# Patient Record
Sex: Female | Born: 1960
Health system: Southern US, Community
[De-identification: ages and names within clinical notes are randomized; demographics above are authoritative.]

## PROBLEM LIST (undated history)

## (undated) ENCOUNTER — Emergency Department (HOSPITAL_COMMUNITY): Payer: BLUE CROSS/BLUE SHIELD | Source: Home / Self Care

## (undated) DIAGNOSIS — R0600 Dyspnea, unspecified: Secondary | ICD-10-CM

## (undated) DIAGNOSIS — F0280 Dementia in other diseases classified elsewhere without behavioral disturbance: Secondary | ICD-10-CM

## (undated) DIAGNOSIS — J302 Other seasonal allergic rhinitis: Secondary | ICD-10-CM

## (undated) DIAGNOSIS — H538 Other visual disturbances: Secondary | ICD-10-CM

## (undated) DIAGNOSIS — I10 Essential (primary) hypertension: Secondary | ICD-10-CM

## (undated) DIAGNOSIS — G43909 Migraine, unspecified, not intractable, without status migrainosus: Secondary | ICD-10-CM

## (undated) DIAGNOSIS — E663 Overweight: Secondary | ICD-10-CM

## (undated) DIAGNOSIS — G300 Alzheimer's disease with early onset: Secondary | ICD-10-CM

## (undated) DIAGNOSIS — R4189 Other symptoms and signs involving cognitive functions and awareness: Secondary | ICD-10-CM

## (undated) HISTORY — DX: Alzheimer's disease with early onset: G30.0

## (undated) HISTORY — DX: Migraine, unspecified, not intractable, without status migrainosus: G43.909

## (undated) HISTORY — DX: Other visual disturbances: H53.8

## (undated) HISTORY — DX: Other symptoms and signs involving cognitive functions and awareness: R41.89

## (undated) HISTORY — DX: Essential (primary) hypertension: I10

## (undated) HISTORY — DX: Dyspnea, unspecified: R06.00

## (undated) HISTORY — DX: Dementia in other diseases classified elsewhere without behavioral disturbance: F02.80

## (undated) HISTORY — DX: Overweight: E66.3

## (undated) HISTORY — DX: Other seasonal allergic rhinitis: J30.2

---

## 1988-05-14 HISTORY — PX: TUBAL LIGATION: SHX77

## 2004-05-14 DIAGNOSIS — G43909 Migraine, unspecified, not intractable, without status migrainosus: Secondary | ICD-10-CM

## 2004-05-14 HISTORY — DX: Migraine, unspecified, not intractable, without status migrainosus: G43.909

## 2009-05-14 DIAGNOSIS — I10 Essential (primary) hypertension: Secondary | ICD-10-CM

## 2009-05-14 HISTORY — DX: Essential (primary) hypertension: I10

## 2012-05-14 DIAGNOSIS — J302 Other seasonal allergic rhinitis: Secondary | ICD-10-CM

## 2012-05-14 HISTORY — DX: Other seasonal allergic rhinitis: J30.2

## 2016-02-28 ENCOUNTER — Encounter: Payer: Self-pay | Admitting: Family Medicine

## 2016-02-28 ENCOUNTER — Ambulatory Visit (INDEPENDENT_AMBULATORY_CARE_PROVIDER_SITE_OTHER): Payer: BLUE CROSS/BLUE SHIELD | Admitting: Family Medicine

## 2016-02-28 VITALS — BP 148/102 | HR 80 | Temp 98.4°F | Ht <= 58 in | Wt 163.0 lb

## 2016-02-28 DIAGNOSIS — I10 Essential (primary) hypertension: Secondary | ICD-10-CM | POA: Diagnosis not present

## 2016-02-28 DIAGNOSIS — R7309 Other abnormal glucose: Secondary | ICD-10-CM | POA: Diagnosis not present

## 2016-02-28 DIAGNOSIS — Z124 Encounter for screening for malignant neoplasm of cervix: Secondary | ICD-10-CM | POA: Insufficient documentation

## 2016-02-28 DIAGNOSIS — Z0000 Encounter for general adult medical examination without abnormal findings: Secondary | ICD-10-CM

## 2016-02-28 DIAGNOSIS — Z23 Encounter for immunization: Secondary | ICD-10-CM

## 2016-02-28 DIAGNOSIS — E785 Hyperlipidemia, unspecified: Secondary | ICD-10-CM | POA: Diagnosis not present

## 2016-02-28 LAB — LIPID PANEL
CHOLESTEROL: 266 mg/dL — AB (ref 125–200)
HDL: 45 mg/dL — ABNORMAL LOW (ref >=46)
LDL Cholesterol: 176 mg/dL — ABNORMAL HIGH (ref <130)
TRIGLYCERIDES: 225 mg/dL — AB (ref <150)
Total CHOL/HDL Ratio: 5.9 Ratio — ABNORMAL HIGH (ref <=5.0)
VLDL: 45 mg/dL — ABNORMAL HIGH (ref <30)

## 2016-02-28 LAB — BASIC METABOLIC PANEL WITH GFR
BUN: 18 mg/dL (ref 7–25)
CHLORIDE: 101 mmol/L (ref 98–110)
CO2: 27 mmol/L (ref 20–31)
Calcium: 10 mg/dL (ref 8.6–10.4)
Creat: 0.93 mg/dL (ref 0.50–1.05)
GFR, EST NON AFRICAN AMERICAN: 68 mL/min (ref >=60)
GFR, Est African American: 79 mL/min (ref >=60)
GLUCOSE: 101 mg/dL — AB (ref 65–99)
POTASSIUM: 4.2 mmol/L (ref 3.5–5.3)
Sodium: 138 mmol/L (ref 135–146)

## 2016-02-28 LAB — POCT GLYCOSYLATED HEMOGLOBIN (HGB A1C): Hemoglobin A1C: 5.6

## 2016-02-28 MED ORDER — HYDROCHLOROTHIAZIDE 25 MG PO TABS: 25.0000 mg | ORAL_TABLET | Freq: Every day | ORAL | 3 refills | Status: DC

## 2016-02-28 NOTE — Progress Notes (Signed)
   Subjective:   Patient ID: Nancy Gilbert    DOB: January 03, 1961, 55 y.o. female   MRN: [id number]  CC: Establishing Care  HPI: Nancy Gilbert is a 55 y.o. female who presents to clinic today to establish as a new patient. Problems discussed today are as follows:  1. Hypertension: patient uncertain how long she was diagnosed. Says she takes ACE-inhibitor daily with aspirin 81 mg daily. Endorses occasional tension headaches. Does not restrict diet and no exercise.   2. Preventative Care: was [place] providers in [place] last year. Only treated for hypertension. Patient was told she has diabetes. Also told she had grade 1 vaginal cancer. Patient denies receiving surgery/chemo/radiation. Says she was followed for 4 years receiving PAP smears until 4 years ago when they said she was "clear of cancer." Says aunt had vaginal cancer and died in past. No c/o fevers, swollen lymph nodes, chest pain, nausea, abdominal pain, or vaginal discharge. Does endorse occasional dyspnea and says her legs will swell sometimes.  ROS: See HPI for pertinent ROS.  PMFSH: Pertinent past medical, surgical, family, and social history were reviewed and updated as appropriate. Smoking status reviewed.  Medications reviewed. Current Outpatient Prescriptions  Medication Sig Dispense Refill  . hydrochlorothiazide (HYDRODIURIL) 25 MG tablet Take 1 tablet (25 mg total) by mouth daily. 90 tablet 3   No current facility-administered medications for this visit.     Objective:   BP (!) 148/102   Pulse 80   Temp 98.4 F (36.9 C) (Oral)   Wt 163 lb (73.9 kg)  Vitals and nursing note reviewed.  General: well nourished, well developed, in no acute distress with non-toxic appearance HEENT: normocephalic, atraumatic, moist mucous membranes Neck: supple, non-tender without lymphadenopathy CV: regular rate and rhythm without murmurs, rubs, or gallops Lungs: clear to auscultation bilaterally  with normal work of breathing Abdomen: soft, non-tender, non-distended, no masses or organomegaly palpable, normoactive bowel sounds Skin: warm, dry, no rashes or lesions, cap refill < 2 seconds Extremities: warm and well perfused, normal tone, small ecchymosis on right wrist and right thigh  Assessment & Plan:   Essential hypertension Uncontrolled. Chronic essential HTN. BP 148/102 in office. On Enalapril 10 mg QD. Has occasional HA. No other previous antihypertensives. Uncertain if diabetic today. - Continue Enalapril 10 mg QD - Starting HCTZ 25 mg QD  - BMET pending for kidney function and evaluate K level - Educated on restricting amount of salt intake (i.e. Fast food and canned vegetables)  Preventative health care [place] from [place]. Uncertain amount of heath care given. Says she was told she has diabetes but never on medications for glucose control. Also says she has h/o "grade 1 vaginal cancer" but was untreated and went away 4 years ago? No documentation provided.  - A1C pending - Lipid panel pending - Received flu shot - Will f/u in 2 weeks and will check PAP smear  Orders Placed This Encounter  Procedures  . Lipid panel  . BASIC METABOLIC PANEL WITH GFR  . POCT glycosylated hemoglobin (Hb A1C)   Meds ordered this encounter  Medications  . hydrochlorothiazide (HYDRODIURIL) 25 MG tablet    Sig: Take 1 tablet (25 mg total) by mouth daily.    Dispense:  90 tablet    Refill:  3    Durward Parcelavid Tynesia Harral, DO [facility], PGY-1 02/28/2016 11:49 AM

## 2016-02-28 NOTE — Patient Instructions (Signed)
Fue Optometrist. Vea a continuacin para revisar nuestro plan para la visita de hoy.  1. Le haremos saber sus resultados de su anlisis de [place]. 2. Le he comenzado con un medicamento llamado HCTZ (informacin a continuacin), tome 25 mg al da junto con su Enalapril y [employer]. 3. Regrese en 2 semanas y haremos un frotis PAP.  Llame a la clnica al [phone] si sus sntomas empeoran o si tiene alguna inquietud. Fue Actor. - Nancy Parcel, DO [facility], PGY-1  [place]; Hydrochlorothiazide, HCTZ Tablet Qu es este medicamento? El ALISKIREN; HIDROCLOROTIAZIDA es una combinacin de un inhibidor de la renina y un diurtico. Este medicamento se [place] para tratar la alta presin sangunea. Este medicamento puede ser utilizado para otros usos; si tiene alguna pregunta consulte con su proveedor de atencin mdica o con su farmacutico. Qu le debo informar a mi profesional de la salud antes de tomar este medicamento? Necesita saber si usted presenta alguno de los siguientes problemas o situaciones: -deshidratacin -diabetes -gota -enfermedad renal o clculos renales -enfermedad heptica -pancreatitis -bajo volumen de orina o dificultad para orinar -una reaccin alrgica o inusual al aliskiren; hidroclorotiazida, HCTZ, a otros medicamentos, alimentos, colorantes o conservantes -si est embarazada o buscando quedar embarazada -si est amamantando a un beb Cmo debo utilizar este medicamento? Tome este medicamento por va oral con un vaso de agua. Siga las instrucciones de la etiqueta del University. Puede tomar [employer] con o sin alimentos, pero trate de tomarlo siempre de la misma manera cada vez. Tome sus dosis a intervalos regulares. No tome su medicamento con una frecuencia mayor a la indicada. No deje de tomarlo excepto si as lo indica su mdico. Hable con su pediatra para informarse acerca del uso de este  medicamento en nios. Puede requerir atencin especial. Sobredosis: Pngase en contacto inmediatamente con un centro toxicolgico o una sala de urgencia si usted cree que haya tomado demasiado medicamento. ATENCIN: [employer] es solo para usted. No comparta este medicamento con nadie. Qu sucede si me olvido de una dosis? Si olvida una dosis, tmela lo antes posible. Si es casi la hora de la prxima dosis, tome slo esa dosis. No tome dosis adicionales o dobles. Qu puede interactuar con este medicamento? -alcohol -atorvastatina -barbitricos -colestiramina -colestipol -digoxina -dofetilida -furosemida -irbesartn -litio -medicamentos para la presin sangunea -medicamentos para la diabetes -medicamentos para infecciones micticas, como quetoconazol -medicamentos para International aid/development worker las msculos antes de una ciruga -medicamentos narcticos para el dolor -los [place], medicamentos para el dolor o inflamacin, como ibuprofeno o naproxeno -suplementos de potasio -esteroides, tales como la cortisona o prednisona Puede ser que esta lista no menciona todas las posibles interacciones. Informe a su profesional de [employer] de [employer] productos a base de hierbas, medicamentos de [place] o suplementos nutritivos que est tomando. Si usted fuma, consume bebidas alcohlicas o si utiliza drogas ilegales, indqueselo tambin a su profesional de [employer]. Algunas sustancias pueden interactuar con su medicamento. A qu debo estar atento al usar [employer] Corporation? Visite a su mdico para chequear su evolucin peridicamente. Controle su presin sangunea como le haya indicado. Pregunte a su mdico cul debe ser su presin sangunea y cundo deber comunicarse con l o ella. Este medicamento puede afectar su nivel de Banker. Si tiene diabetes, consulte a su mdico o a su profesional de la salud antes de Multimedia programmer la dosis de su medicamento para la diabetes. No tome este medicamento  si tiene  diabetes y est tomando un medicamento llamado bloqueador de los receptores de angiotensina o inhibidor de la enzima convertidora de la angiotensina. Consulte a su mdico o su profesional de la salud para obtener ms informacin. Las mujeres deben informar a su mdico si estn buscando quedar embarazadas o si creen que estn embarazadas. Existe la posibilidad de efectos secundarios graves a un beb sin nacer. Para ms informacin hable con su profesional de la salud o su farmacutico. Puede necesitar seguir una dieta especial mientras est tomando este medicamento. Consulte a su mdico acerca de esto. Si sufre ataques severos de diarrea, nuseas, vmitos o si suda demasiado, consulte con su mdico o su profesional de Beazer Homesla salud. La prdida excesiva de lquidos corporales puede hacer que sea peligroso tomar PPL Corporationeste medicamento. Puede experimentar somnolencia o mareos. No conduzca ni utilice maquinaria ni haga nada que Scientist, research (life sciences)le exija permanecer en estado de alerta hasta que sepa cmo le afecta este medicamento. No se siente ni se ponga de pie con rapidez, especialmente si es un paciente de edad avanzada. Esto reduce el riesgo de mareos o Newell Rubbermaiddesmayos. El alcohol puede interferir con el efecto de [place] medicamento. Evite consumir bebidas alcohlicas. Este medicamento puede aumentar su sensibilidad al sol. Mantngase fuera de Secretary/administratorla luz solar. Si no lo puede evitar, utilice ropa protectora y crema de Orthoptistproteccin solar. No utilice lmparas solares, camas solares ni cabinas solares. Qu efectos secundarios puedo tener al Boston Scientificutilizar este medicamento? Efectos secundarios que debe informar a su mdico o a Producer, television/film/videosu profesional de la salud tan pronto como sea posible: -Therapist, artreacciones alrgicas como erupcin cutnea o urticarias, hinchazn de las manos, pies, cara, labios, garganta o lengua -problemas respiratorios -cambios en la visin -dolor en los ojos -pulso cardiaco rpido, irregular -sensacin de mareos o aturdimiento, cadas -fiebre o  dolor de garganta -dolor de gota -baja presin sangunea -dolor o calambre muscular -dolor, hormigueo o entumecimiento de manos o pies -dolor o dificultad para orinar -enrojecimiento, formacin de ampollas, descamacin o aflojamiento de la piel, inclusive dentro de la boca -convulsiones -cansancio o debilidad inusual Efectos secundarios que, por lo general, no requieren atencin mdica (debe informarlos a su mdico o a su profesional de la salud si persisten o si son molestos): -cambios en el deseo sexual o capacidad -tos -diarrea -mareos -boca seca -sntomas gripales -dolor de cabeza -Programme researcher, broadcasting/film/videomalestar estomacal Puede ser que esta lista no menciona todos los posibles efectos secundarios. Comunquese a su mdico por asesoramiento mdico Hewlett-Packardsobre los efectos secundarios. Usted puede informar los efectos secundarios a la FDA por telfono al 1-800-FDA-1088. Dnde debo guardar mi medicina? Mantngala fuera del alcance de los nios. Gurdela a Sanmina-SCItemperatura ambiente, entre 15 y 30 grados C ([phone] y [phone] grados F). Protjala de la humedad. Deseche todo el medicamento que no haya utilizado, despus de la fecha de vencimiento. ATENCIN: Este folleto es un resumen. Puede ser que no cubra toda la posible informacin. Si usted tiene preguntas acerca de esta medicina, consulte con su mdico, su farmacutico o su profesional de Radiographer, therapeuticla salud.    2016, Elsevier/Gold Standard. (2014-06-23 00:00:00)

## 2016-02-28 NOTE — Assessment & Plan Note (Addendum)
New patient from [place]. Uncertain amount of heath care given. Says she was told she has diabetes but never on medications for glucose control. Also says she has h/o "grade 1 vaginal cancer" but was untreated and went away 4 years ago? No documentation provided.  - A1C pending - Lipid panel pending - Aspirin 81 mg daily - Received flu shot - Will f/u in 2 weeks and will check PAP smear

## 2016-02-28 NOTE — Assessment & Plan Note (Addendum)
Uncontrolled. Chronic essential HTN. BP 148/102 in office. On Enalapril 10 mg QD. Has occasional HA. No other previous antihypertensives. Uncertain if diabetic today. - Continue Enalapril 10 mg QD - Starting HCTZ 25 mg QD  - BMET pending for kidney function and evaluate K level - Educated on restricting amount of salt intake (i.e. Fast food and canned vegetables)

## 2016-02-29 ENCOUNTER — Encounter: Payer: Self-pay | Admitting: Family Medicine

## 2016-03-13 ENCOUNTER — Ambulatory Visit (INDEPENDENT_AMBULATORY_CARE_PROVIDER_SITE_OTHER): Payer: BLUE CROSS/BLUE SHIELD | Admitting: Family Medicine

## 2016-03-13 ENCOUNTER — Other Ambulatory Visit (HOSPITAL_COMMUNITY)
Admission: RE | Admit: 2016-03-13 | Discharge: 2016-03-13 | Disposition: A | Discharge status: Home / Self Care | Payer: BLUE CROSS/BLUE SHIELD | Source: Ambulatory Visit | Attending: Family Medicine | Admitting: Family Medicine

## 2016-03-13 ENCOUNTER — Other Ambulatory Visit: Payer: Self-pay | Admitting: Family Medicine

## 2016-03-13 ENCOUNTER — Encounter: Payer: Self-pay | Admitting: Family Medicine

## 2016-03-13 VITALS — BP 139/91 | HR 77 | Temp 97.6°F | Wt 162.0 lb

## 2016-03-13 DIAGNOSIS — R0600 Dyspnea, unspecified: Secondary | ICD-10-CM

## 2016-03-13 DIAGNOSIS — Z124 Encounter for screening for malignant neoplasm of cervix: Secondary | ICD-10-CM

## 2016-03-13 DIAGNOSIS — Z114 Encounter for screening for human immunodeficiency virus [HIV]: Secondary | ICD-10-CM | POA: Diagnosis not present

## 2016-03-13 DIAGNOSIS — Z1159 Encounter for screening for other viral diseases: Secondary | ICD-10-CM | POA: Diagnosis not present

## 2016-03-13 DIAGNOSIS — I10 Essential (primary) hypertension: Secondary | ICD-10-CM | POA: Diagnosis not present

## 2016-03-13 DIAGNOSIS — Z01419 Encounter for gynecological examination (general) (routine) without abnormal findings: Secondary | ICD-10-CM | POA: Insufficient documentation

## 2016-03-13 DIAGNOSIS — Z1151 Encounter for screening for human papillomavirus (HPV): Secondary | ICD-10-CM | POA: Insufficient documentation

## 2016-03-13 DIAGNOSIS — E782 Mixed hyperlipidemia: Secondary | ICD-10-CM | POA: Insufficient documentation

## 2016-03-13 DIAGNOSIS — Z1231 Encounter for screening mammogram for malignant neoplasm of breast: Secondary | ICD-10-CM

## 2016-03-13 HISTORY — DX: Dyspnea, unspecified: R06.00

## 2016-03-13 LAB — TSH: TSH: 0.84 m[IU]/L

## 2016-03-13 MED ORDER — ENALAPRIL-HYDROCHLOROTHIAZIDE 10-25 MG PO TABS: 1.0000 | ORAL_TABLET | Freq: Every day | ORAL | 3 refills | Status: DC

## 2016-03-13 MED ORDER — ATORVASTATIN CALCIUM 20 MG PO TABS: 20.0000 mg | ORAL_TABLET | Freq: Every day | ORAL | 3 refills | Status: DC

## 2016-03-13 NOTE — Assessment & Plan Note (Addendum)
Acute. Onset 6 months. Randomly occurs and short lived. Pt able to help control her breathing by thinking and calming herself. Does not appear to be related to exertion. No respiratory findings during exam. - Discussed with patient need for recording episodes when, duration, and what she is doing during attacks - Red flags dicussed with patient when to seek medical attention - F/u in 1 month

## 2016-03-13 NOTE — Patient Instructions (Signed)
Fue Optometristun placer conocerte hoy. Consulte a continuacin para revisar nuestro plan para la visita de hoy.  1. Se le notificar su PAP y los resultados de [place]lisis de [place]. 2. He combinado Applied Materialssus dos medicamentos para la presin arterial en 1 pastilla Multimedia programmerllamada Vaseretic. Tomar esta pldora x1 vez por da. Por favor, tome esto en la farmacia. Por favor suspenda sus pastillas Enalapril y HCTZ y tome la nueva Pension scheme managerpastilla Vaseretic. 3. Comenzar con un nuevo medicamento llamado Atorvastatin. Esto te ayudar a Designer, jewellerycontrolar tus niveles de colesterol. Toma Halliburton Companyesto todos los das. He adjuntado informacin [place] Airlinessobre este medicamento. Si experimenta dolor muscular o debilidad, detenga el medicamento. 4. Voy a programar su mamografa para su dolor de senos. No encontr nada en mi examen. 5. Registre a qu hora y por cunto tiempo y qu est haciendo este mes cuando tenga estos ataques respiratorios. Regstrelos en el papel que [employer] di. 6. Regrese en 1 mes.  Llame a la clnica al [phone] si sus sntomas empeoran o si tiene alguna inquietud. Fue Actorun placer verte. - Durward Parcelavid Aleph Nickson, DO [facility], PGY-1  Atorvastatin tablets Qu es este medicamento? La ATORVASTATINA es conocido como un inhibidor de la HMG-CoA reductasa o 'estatina'. Reduce el nivel de colesterol y triglicridos en la sangre. Este medicamento tambin puede reducir el riesgo de Marine scientistpadecer ataques cardiacos, derrame cerebral u otros problemas de la salud en pacientes que corren el riesgo de padecer una enfermedad cardiaca. Marnee GuarneriUna dieta y cambios a su estilo de vida son a menudo utilizados con Industrial/product designereste medicamento. Este medicamento puede ser utilizado para otros usos; si tiene alguna pregunta consulte con su proveedor de atencin mdica o con su farmacutico. Qu le debo informar a mi profesional de la salud antes de tomar este medicamento? Necesita saber si usted presenta alguno de los siguientes problemas o situaciones: -consume bebidas alcohlicas con  frecuencia -antecedentes de derrame cerebral, TIA -enfermedad renal -enfermedad heptica -dolores o debilidades musculares -otra condicin mdica -una reaccin alrgica o inusual a la atorvastatina, a otros medicamentos, alimentos, colorantes o conservadores -si est embarazada o buscando quedar embarazada -si est amamantando a un beb Cmo debo utilizar este medicamento? Tome este medicamento por va oral con un vaso de agua. Siga las instrucciones de la etiqueta del [place] Hillsmedicamento. Puede tomar este medicamento con o sin alimentos. Tome sus dosis a intervalos regulares. No tome su medicamento con una frecuencia mayor a la indicada. Hable con su pediatra para informarse acerca del uso de este medicamento en nios. Aunque este medicamento ha sido recetado a nios tan menores como de 10 aos de edad para condiciones selectivas, las precauciones se aplican. Sobredosis: Pngase en contacto inmediatamente con un centro toxicolgico o una sala de urgencia si usted cree que haya tomado demasiado medicamento. ATENCIN: Reynolds AmericanEste medicamento es solo para usted. No comparta este medicamento con nadie. Qu sucede si me olvido de una dosis? Si olvida una dosis, tmela lo antes posible. Si es casi la hora de su dosis siguiente, tome slo esa dosis. No tome dosis adicionales o dobles. Qu puede interactuar con este medicamento? No tome esta medicina con ninguno de los siguientes medicamentos: -levadura roja de arroz -telaprevir -telitromicina -voriconazol Esta medicina tambin puede interactuar con los siguientes medicamentos: -alcohol -medicamentos antivirales para el VIH o SIDA -boceprevir -ciertos antibiticos, tales como Facilities managerla claritromicina, eritromicina, troleandomicina -ciertos medicamentos para el colesterol, tales como fenofibrato o gemfibrozil -cimetidina -claritromicina -colchicina -ciclosporina -digoxina -hormonas femeninas, como estrgenos, progestinas o pldoras anticonceptivas -jugo de  toronja -medicamentos para  las infecciones micticas, tales como fluconazol, itraconazol, quetoconazol -niacina -rifampicina -espironolactona Puede ser que esta lista no menciona todas las posibles interacciones. Informe a su profesional de Beazer Homesla salud de Ingram Micro Inctodos los productos a base de hierbas, medicamentos de [place] Forestventa libre o suplementos nutritivos que est tomando. Si usted fuma, consume bebidas alcohlicas o si utiliza drogas ilegales, indqueselo tambin a su profesional de Beazer Homesla salud. Algunas sustancias pueden interactuar con su medicamento. A qu debo estar atento al usar PPL Corporationeste medicamento? Visite a su mdico o a su profesional de la salud para chequeos peridicos. Puede necesitar exmenes regulares para asegurarse de que el hgado est funcionando bien. Informe a su mdico o a su profesional de la salud tan pronto como pueda si tiene debilidad, sensibilidad o dolores musculares que no tienen explicacin, especialmente si tambin tiene fiebre y Kendallcansancio. Su mdico o profesional de Radiographer, therapeuticla salud puede informarle que deje de tomar este medicamento si desarrolla problemas musculares. Si sus problemas musculares no desaparecen despus de parar este medicamento, comunquese con su profesional de Beazer Homesla salud. Este medicamento es slo parte de un programa integral para la salud de Programmer, multimediacorazn. Su mdico o dietista pueden sugerirle una dieta con bajo contenido de colesterol y de grasa para ayudarle. Evite el alcohol y Art therapistfumar, y [place] Jan Mayen Islandsmantenga un programa adecuado de ejercicios fsicos. No utilice este medicamento si est embarazada o amamantando. Existe la posibilidad de efectos secundarios graves a un beb sin nacer o a Radio broadcast assistantun lactante. Para ms informacin hable con su farmacutico o su mdico. Este medicamento puede afectar su nivel de azcar en la sangre. Si tiene diabetes, consulte a su mdico o a su profesional de la salud antes de cambiar su dieta o la dosis de su medicamento para la diabetes. Si va a someterse a una operacin,  informe a su profesional de la salud que est tomando PPL Corporationeste medicamento. Qu efectos secundarios puedo tener al Boston Scientificutilizar este medicamento? Efectos secundarios que debe informar a su mdico o a Producer, television/film/videosu profesional de la salud tan pronto como sea posible: -Therapist, artreacciones alrgicas como erupcin cutnea, picazn o urticarias, hinchazn de la cara, labios o lengua -orina de color amarillo oscuro -fiebre -dolores articulares -calambres, dolores musculares -enrojecimiento, formacin de ampollas, descamacin o distensin de la piel, inclusive dentro de la boca -dificultad para orinar o cambios en el volumen de orina -cansancio o debilidad inusual -color amarillento de ojos o piel Efectos secundarios que, por lo general, no requieren atencin mdica (debe informarlos a su mdico o a su profesional de la salud si persisten o si son molestos): -estreimiento -acidez de estmago -gas, dolor, Programme researcher, broadcasting/film/videomalestar estomacal Puede ser que esta lista no menciona todos los posibles efectos secundarios. Comunquese a su mdico por asesoramiento mdico Hewlett-Packardsobre los efectos secundarios. Usted puede informar los efectos secundarios a la FDA por telfono al 1-800-FDA-1088. Dnde debo guardar mi medicina? Mantngala fuera del alcance de los nios. Gurdela a una Black & Deckertemperatura ambiente entre 20 y 25 grados C ([phone] y [phone] grados F). Deseche todo el medicamento que no haya utilizado, despus de la fecha de vencimiento. ATENCIN: Este folleto es un resumen. Puede ser que no cubra toda la posible informacin. Si usted tiene preguntas acerca de esta medicina, consulte con su mdico, su farmacutico o su profesional de Radiographer, therapeuticla salud.    2016, Elsevier/Gold Standard. (2014-06-22 00:00:00)

## 2016-03-13 NOTE — Assessment & Plan Note (Addendum)
Controlled. BP 139/91 today. Patient on ACE-I and thiazide. Was seen on 02/28/16 and started on HCTZ. Headache resolved per patient. - Discontinuing Enalapril 10 mg QD and HCTZ 25 mg QD - Will start combined Enalapril-HCTZ 10-25 QD - CMET pending K level  - ASA 81 mg QD - F/u in 1 month

## 2016-03-13 NOTE — Assessment & Plan Note (Addendum)
Due for PAP. Will screen cytology and HPV test. No abnormal findings on examination. - Pending PAP cytology and HPV test, f/u PAP screen depending on results - F/u in 1 month

## 2016-03-13 NOTE — Progress Notes (Signed)
Subjective:   Patient ID: Nancy Gilbert    DOB: 12/16/1960, 55 y.o. female   MRN: [id number]  CC: PAP screening  HPI: Nancy Gilbert is a 55 y.o. female who presents to clinic today for PAP screen. Problems discussed today are as follows:  1. Essential Hypertension: headache has resolved since visit 2 weeks ago. Says she has been tolerating her new HCTZ well. Says he leg swelling has been minimal but improved. Continues to take aspirin daily with enalapril.  2. Cervical Cancer Screen: due for PAP smear. Says she was diagnosed with "grade 1 vaginal cancer" [place]. Never underwent treatment and revolved on its own after 4 years per patient. No complaints of vaginal masses or discharge.  3. Dyslipidemia: never smoked. Says she does not watch what she eats. Says she has never been told she has elevated cholesterol. No prior statin therapy.   4. Dyspnea: onset 6 months ago. Seems to occur very intermittently. Last episode last week. Not worse with exercise. Occurs during day and at night when lying down. Does have minimal pitting edema on lower extremities near ankles but improved since starting HCTZ. Denies chest pain, palpitations, PND, orthopnea.  ROS: Complete ROS performed, see HPI for pertinent ROS.  PMFSH: Pertinent past medical, surgical, family, and social history were reviewed and updated as appropriate. Smoking status reviewed.  Medications reviewed. Current Outpatient Prescriptions  Medication Sig Dispense Refill  . aspirin EC 81 MG tablet Take 81 mg by mouth daily.    Marland Kitchen. atorvastatin (LIPITOR) 20 MG tablet Take 1 tablet (20 mg total) by mouth daily. 30 tablet 3  . enalapril-hydrochlorothiazide (VASERETIC) 10-25 MG tablet Take 1 tablet by mouth daily. 30 tablet 3   No current facility-administered medications for this visit.     Objective:   BP (!) 139/91   Pulse 77   Temp 97.6 F (36.4 C) (Oral)   Wt 162 lb  (73.5 kg)   BMI 790.96 kg/m  Vitals and nursing note reviewed.  General: well nourished, well developed, in no acute distress with non-toxic appearance HEENT: normocephalic, atraumatic, moist mucous membranes Neck: supple, non-tender without lymphadenopathy CV: regular rate and rhythm without murmurs, rubs, or gallops, no lower extremity edema Lungs: clear to auscultation bilaterally with normal work of breathing Abdomen: soft, non-tender, non-distended, no masses or organomegaly palpable, normoactive bowel sounds GU: accompanied by chaperone no external lesions or masses appreciated on exam, no odor, no vaginal discharge on speculum exam, no blood or clots in vaginal vault, cervical os appreciated and swabbed, no adnexal tenderness Breast: accompanied by chaperone, no asymmetry to breasts and no rash or lesions appreciated, no masses felt on palpation, no drainage at nipple, no axillary adenopathy Skin: warm, dry, no rashes or lesions, cap refill < 2 seconds Extremities: warm and well perfused, normal tone  Assessment & Plan:   Essential hypertension Controlled. BP 139/91 today. Patient on ACE-I and thiazide. Was seen on 02/28/16 and started on HCTZ. Headache resolved per patient. - Discontinuing Enalapril 10 mg QD and HCTZ 25 mg QD - Will start combined Enalapril-HCTZ 10-25 QD - CMET pending K level  - ASA 81 mg QD - F/u in 1 month  Mixed hyperlipidemia Uncontrolled. Lipid panel on 02/28/16 showed total 266, TGY 225, LDL 176, HDL 45. A1c was controlled 5.6%. No previous h/o statin therapy. Never smoker. ASCVD score 9.4. - CMET pending liver function tests - TSH pending for hypothyroid screening -  Will start Atorvastatin 20 mg QD - Red flags discussed with pt including muscle pain and weakness, information packet given to pt - Recheck fasting lipid in 1 month  Encounter for Papanicolaou smear for cervical cancer screening Due for PAP. Will screen cytology and HPV test. No abnormal  findings on examination. - Pending PAP cytology and HPV test, f/u PAP screen depending on results - F/u in 1 month  Dyspnea Acute. Onset 6 months. Randomly occurs and short lived. Pt able to help control her breathing by thinking and calming herself. Does not appear to be related to exertion. No respiratory findings during exam. - Discussed with patient need for recording episodes when, duration, and what she is doing during attacks - Red flags dicussed with patient when to seek medical attention - F/u in 1 month   Orders Placed This Encounter  Procedures  . HIV antibody  . Hepatitis C antibody  . COMPLETE METABOLIC PANEL WITH GFR  . TSH   Meds ordered this encounter  Medications  . enalapril-hydrochlorothiazide (VASERETIC) 10-25 MG tablet    Sig: Take 1 tablet by mouth daily.    Dispense:  30 tablet    Refill:  3  . atorvastatin (LIPITOR) 20 MG tablet    Sig: Take 1 tablet (20 mg total) by mouth daily.    Dispense:  30 tablet    Refill:  3    Durward Parcelavid Ixchel Duck, DO [facility], PGY-1 03/13/2016 2:08 PM

## 2016-03-13 NOTE — Assessment & Plan Note (Addendum)
Uncontrolled. Lipid panel on 02/28/16 showed total 266, TGY 225, LDL 176, HDL 45. A1c was controlled 5.6%. No previous h/o statin therapy. Never smoker. ASCVD score 9.4. - CMET pending liver function tests - TSH pending for hypothyroid screening - Will start Atorvastatin 20 mg QD - Red flags discussed with pt including muscle pain and weakness, information packet given to pt - Recheck fasting lipid in 1 month

## 2016-03-14 LAB — COMPLETE METABOLIC PANEL WITH GFR
ALT: 16 U/L (ref 6–29)
AST: 21 U/L (ref 10–35)
Albumin: 4.4 g/dL (ref 3.6–5.1)
Alkaline Phosphatase: 86 U/L (ref 33–130)
BILIRUBIN TOTAL: 0.3 mg/dL (ref 0.2–1.2)
BUN: 13 mg/dL (ref 7–25)
CALCIUM: 10.2 mg/dL (ref 8.6–10.4)
CHLORIDE: 100 mmol/L (ref 98–110)
CO2: 30 mmol/L (ref 20–31)
CREATININE: 0.92 mg/dL (ref 0.50–1.05)
GFR, EST AFRICAN AMERICAN: 80 mL/min (ref >=60)
GFR, EST NON AFRICAN AMERICAN: 69 mL/min (ref >=60)
Glucose, Bld: 92 mg/dL (ref 65–99)
Potassium: 4 mmol/L (ref 3.5–5.3)
Sodium: 140 mmol/L (ref 135–146)
Total Protein: 7.6 g/dL (ref 6.1–8.1)

## 2016-03-14 LAB — CYTOLOGY - PAP
Diagnosis: NEGATIVE
HPV: NOT DETECTED

## 2016-03-14 LAB — HEPATITIS C ANTIBODY: HCV AB: NEGATIVE

## 2016-03-14 LAB — HIV ANTIBODY (ROUTINE TESTING W REFLEX): HIV: NONREACTIVE

## 2016-03-15 ENCOUNTER — Encounter: Payer: Self-pay | Admitting: Family Medicine

## 2016-04-03 ENCOUNTER — Ambulatory Visit: Payer: BLUE CROSS/BLUE SHIELD | Admitting: Family Medicine

## 2016-04-03 ENCOUNTER — Ambulatory Visit
Admission: RE | Admit: 2016-04-03 | Discharge: 2016-04-03 | Disposition: A | Discharge status: Home / Self Care | Payer: BLUE CROSS/BLUE SHIELD | Source: Ambulatory Visit | Attending: Chiropractic Medicine | Admitting: Chiropractic Medicine

## 2016-04-03 DIAGNOSIS — Z1231 Encounter for screening mammogram for malignant neoplasm of breast: Secondary | ICD-10-CM

## 2016-04-09 ENCOUNTER — Encounter: Payer: Self-pay | Admitting: Family Medicine

## 2016-04-09 ENCOUNTER — Ambulatory Visit (INDEPENDENT_AMBULATORY_CARE_PROVIDER_SITE_OTHER): Payer: BLUE CROSS/BLUE SHIELD | Admitting: Family Medicine

## 2016-04-09 DIAGNOSIS — I10 Essential (primary) hypertension: Secondary | ICD-10-CM | POA: Diagnosis not present

## 2016-04-09 DIAGNOSIS — R0600 Dyspnea, unspecified: Secondary | ICD-10-CM

## 2016-04-09 DIAGNOSIS — E782 Mixed hyperlipidemia: Secondary | ICD-10-CM

## 2016-04-09 LAB — CBC
HEMATOCRIT: 39 % (ref 35.0–45.0)
HEMOGLOBIN: 13.3 g/dL (ref 11.7–15.5)
MCH: 29.7 pg (ref 27.0–33.0)
MCHC: 34.1 g/dL (ref 32.0–36.0)
MCV: 87.1 fL (ref 80.0–100.0)
MPV: 11.6 fL (ref 7.5–12.5)
Platelets: 280 10*3/uL (ref 140–400)
RBC: 4.48 MIL/uL (ref 3.80–5.10)
RDW: 13.8 % (ref 11.0–15.0)
WBC: 8.1 10*3/uL (ref 3.8–10.8)

## 2016-04-09 NOTE — Assessment & Plan Note (Addendum)
Intermittent. Does not appear to be related to exertion or affect pt at certain time of day. Patient w/o smoking or COPD hx. Heart and lung exam unremarkable. No arrhythmias. TSH wnl last visit. Patient has several symptoms which seem to be unrelated. Concern for psych or anxiety component but would need to r/o other possible sources. --Will obtain CBC to r/o anemia --Will continue to monitor  --F/u in 3 months after returning from [place]

## 2016-04-09 NOTE — Assessment & Plan Note (Addendum)
Chronic. On statin therapy. Tolerating well. ASCVD score 9.4%.  --Continue Lipitor 20 mg --Needs f/u fasting lipid panel --Educated on importance of exercise regimen

## 2016-04-09 NOTE — Patient Instructions (Signed)
Fue Optometristun placer conocerte hoy. Consulte a continuacin para revisar nuestro plan para la visita de hoy.  1. Contine tomndole un medicamento para la presin arterial llamado Vaseretic y su medicamento para el colesterol llamado Lipitor. Asegrese de tener un suministro de 2 meses antes de irse. Puede recoger esto en su farmacia. 2. Obtendr un anlisis de sangre y International aid/development workerle notificar los [place]ados. 3. Seguimiento en 3 meses.  Llame a la clnica al [phone] si sus sntomas empeoran o si tiene alguna inquietud. Fue Actorun placer verte. - Durward Parcelavid Krishauna Schatzman, DO [facility], PGY-1

## 2016-04-09 NOTE — Progress Notes (Signed)
Subjective:   Patient ID: Nancy Gilbert    DOB: March 10, 1961, 55 y.o. female   MRN: [id number]  CC: "High blood pressure follow up"  HPI: Nancy SchullerMargarita Lopez de Gretta Gilbert is a 55 y.o. female who presents to clinic today for HTN and HLD f/u. Problems discussed today are as follows:  1. Hypertension: patient continues to be w/o headaches since starting HCTZ. Denies LE edema. Says she has been tolerating the increase in HCTZ w/o porblems. Patient continues to take her ACE-I/HCTZ combination and likes she only has to take 1 pill.  2. Hyperlipidemia: patient continues to eat fatty rich foods but is aware of what she should avoid eating. She has tolerated the Lipitor and denies muscle or joint aches.   3. Dyspnea: patient continues to have feelings like she at times cannot get enough air. She says she feels as though she cannot open her mouth and feels a discomfort under her jaw. These symptoms seem to be unrelated to exertion and patient does not have limitations on ADLs.  ROS: Complete ROS performed, see HPI for pertinent ROS.  PMFSH: Pertinent past medical, surgical, family, and social history were reviewed and updated as appropriate. Smoking status reviewed.  Medications reviewed. Current Outpatient Prescriptions  Medication Sig Dispense Refill  . atorvastatin (LIPITOR) 20 MG tablet Take 1 tablet (20 mg total) by mouth daily. 30 tablet 3  . enalapril-hydrochlorothiazide (VASERETIC) 10-25 MG tablet Take 1 tablet by mouth daily. 30 tablet 3  . aspirin EC 81 MG tablet Take 81 mg by mouth daily.     No current facility-administered medications for this visit.     Objective:   BP 129/85 (BP Location: Left Arm, Patient Position: Sitting, Cuff Size: Normal)   Pulse 85   Temp 98.2 F (36.8 C) (Oral)   Wt 156 lb 9.6 oz (71 kg)   LMP  (Exact Date)   SpO2 99%   BMI 764.60 kg/m  Vitals and nursing note reviewed.  General: well nourished, well developed, in no acute distress with  non-toxic appearance HEENT: normocephalic, atraumatic, moist mucous membranes, patent pharynx w/o swollen tonsils, no jaw claudication  Neck: supple, non-tender without lymphadenopathy, no thyroid nodule appreciated CV: regular rate and rhythm without murmurs, rubs, or gallops, no lower extremity edema Lungs: clear to auscultation bilaterally with normal work of breathing Abdomen: soft, non-tender, non-distended, normoactive bowel sounds Skin: warm, dry, no rashes or lesions, cap refill < 2 seconds Extremities: warm and well perfused, normal tone  Assessment & Plan:   Dyspnea Intermittent. Does not appear to be related to exertion or affect pt at certain time of day. Patient w/o smoking or COPD hx. Heart and lung exam unremarkable. No arrhythmias. TSH wnl last visit. Patient has several symptoms which seem to be unrelated. Concern for psych or anxiety component but would need to r/o other possible sources. --Will obtain CBC to r/o anemia --Will continue to monitor  --F/u in 3 months after returning from [place]  Mixed hyperlipidemia Chronic. On statin therapy. Tolerating well. ASCVD score 9.4%.  --Continue Lipitor 20 mg --Needs f/u fasting lipid panel --Educated on importance of exercise regimen  Essential hypertension Chronic. Controlled. On ACE-I and thiazide. BP 129/85 in office.  --Continue Vaseretic 10-25 mg QD --Patient encouraged to avoid foods rich in sodium --Would benefit from a weekly exercise regimen of 150 mins of moderate intensity --Pt told to obtain 2 month supply of meds before leaving to [place] for 2 months  Orders Placed This  Encounter  Procedures  . CBC   No orders of the defined types were placed in this encounter.   Durward Parcelavid McMullen, DO [facility], PGY-1 04/10/2016 9:53 AM

## 2016-04-09 NOTE — Assessment & Plan Note (Addendum)
Chronic. Controlled. On ACE-I and thiazide. BP 129/85 in office.  --Continue Vaseretic 10-25 mg QD --Patient encouraged to avoid foods rich in sodium --Would benefit from a weekly exercise regimen of 150 mins of moderate intensity --Pt told to obtain 2 month supply of meds before leaving to [place] for 2 months

## 2016-04-10 ENCOUNTER — Encounter: Payer: Self-pay | Admitting: Family Medicine

## 2016-07-31 ENCOUNTER — Other Ambulatory Visit: Payer: Self-pay | Admitting: Family Medicine

## 2016-07-31 DIAGNOSIS — I10 Essential (primary) hypertension: Secondary | ICD-10-CM

## 2016-09-24 ENCOUNTER — Ambulatory Visit: Payer: BLUE CROSS/BLUE SHIELD | Admitting: Obstetrics and Gynecology

## 2016-09-25 ENCOUNTER — Encounter: Payer: Self-pay | Admitting: Family Medicine

## 2016-09-25 ENCOUNTER — Ambulatory Visit (INDEPENDENT_AMBULATORY_CARE_PROVIDER_SITE_OTHER): Payer: BLUE CROSS/BLUE SHIELD | Admitting: Family Medicine

## 2016-09-25 VITALS — BP 128/90 | HR 78 | Temp 98.2°F | Wt 161.0 lb

## 2016-09-25 DIAGNOSIS — M75101 Unspecified rotator cuff tear or rupture of right shoulder, not specified as traumatic: Secondary | ICD-10-CM | POA: Diagnosis not present

## 2016-09-25 DIAGNOSIS — G44201 Tension-type headache, unspecified, intractable: Secondary | ICD-10-CM | POA: Diagnosis not present

## 2016-09-25 DIAGNOSIS — H538 Other visual disturbances: Secondary | ICD-10-CM

## 2016-09-25 DIAGNOSIS — E782 Mixed hyperlipidemia: Secondary | ICD-10-CM

## 2016-09-25 HISTORY — DX: Other visual disturbances: H53.8

## 2016-09-25 HISTORY — DX: Unspecified rotator cuff tear or rupture of right shoulder, not specified as traumatic: M75.101

## 2016-09-25 MED ORDER — ASPIRIN-ACETAMINOPHEN-CAFFEINE 250-250-65 MG PO TABS: 2.0000 | ORAL_TABLET | Freq: Four times a day (QID) | ORAL | 2 refills | Status: DC | PRN

## 2016-09-25 MED ORDER — ATORVASTATIN CALCIUM 20 MG PO TABS: 20.0000 mg | ORAL_TABLET | Freq: Every day | ORAL | 3 refills | Status: DC

## 2016-09-25 NOTE — Assessment & Plan Note (Signed)
  Chronic, had been followed in [place] for PT and was told she may need surgery in future. Has images on CDs with her  -referral to sports medicine made today

## 2016-09-25 NOTE — Patient Instructions (Signed)
Cefalea tensional (Tension Headache) Una cefalea tensional es un dolor o presin que se siente en la frente y los lados de la cabeza. Estas cefaleas pueden durar de 30minutos a varios das. CUIDADOS EN EL HOGAR Control del W. R. Berkleydolor  Tome los medicamentos de venta libre y los recetados solamente como se lo haya indicado el mdico.  Cuando sienta dolor de cabeza acustese en un cuarto oscuro y tranquilo.  Si se lo indican, aplquese hielo en la zona de la cabeza y el cuello:  Ponga el hielo en una bolsa plstica.  Coloque una toalla entre la piel y la bolsa de hielo.  Coloque el hielo durante 20minutos, 2 a 3veces por Futures traderda.  Utilice una almohadilla trmica o tome una ducha caliente para aplicar calor en la zona de la cabeza y el cuello segn las indicaciones del mdico. Comida y bebida  Mantenga un horario para las comidas.  No beba mucho alcohol.  No consuma mucha cafena ni deje de consumirla por completo. Instrucciones generales  Concurra a todas las visitas de control como se lo haya indicado el mdico. Esto es importante.  Lleve un registro diario para Financial risk analystaveriguar si ciertas cosas provocan los dolores de Turkmenistancabeza. Por ejemplo, escriba los siguientes datos:  Lo que usted come y Estate agentbebe.  Cunto tiempo duerme.  Algn cambio en su dieta o en los medicamentos.  Pruebe recibir Customer service managerun masaje o hacer otras cosas que lo ayuden a Lexicographerrelajarse.  Disminuya el nivel de estrs.  Sintese con la espalda recta. No contraiga (tensione) los msculos.  No consuma productos que contengan tabaco. Estos incluyen cigarrillos, tabaco para mascar y Administrator, Civil Servicecigarrillos electrnicos. Si necesita ayuda para dejar de fumar, consulte al mdico.  Haga ejercicios con regularidad tal como se lo indic el mdico.  Duerma lo suficiente. Es Designer, jewellerydecir, entre 7 y 9horas de sueo. SOLICITE AYUDA SI:  Los medicamentos no logran [employer]bury Automotive Groupaliviar los sntomas.  Tiene un dolor de cabeza que es diferente del dolor de cabeza  habitual.  Tiene Dentistmalestar estomacal (nuseas) o vomita.  Tiene fiebre. SOLICITE AYUDA DE INMEDIATO SI:  La cefalea es muy intensa.  Sigue vomitando.  Presenta rigidez en el cuello.  Tiene dificultad para ver.  Tiene dificultad para hablar.  Siente dolor en el ojo o en el odo.  Sus msculos estn dbiles, o pierde el control muscular.  Pierde el equilibrio o tiene problemas para Advertising account plannercaminar.  Siente que se desvanece (pierde el conocimiento) o se desmaya.  Se siente confundido. Esta informacin no tiene Theme park managercomo fin reemplazar el consejo del mdico. Asegrese de hacerle al mdico cualquier pregunta que tenga. Document Released: 07/23/2011 Document Revised: 01/19/2015 Document Reviewed: 08/23/2014 Elsevier Interactive Patient Education  2017 [employer]

## 2016-09-25 NOTE — Assessment & Plan Note (Signed)
  Wears glasses, [place]  -referral made to ophtho

## 2016-09-25 NOTE — Assessment & Plan Note (Signed)
  Acute on chronic problem. Has headaches chronically but cannot get rid of this one. Neuro exam normal. Visual acuity in tact.  -rx given for excedrin migraine to try -follow up if worsens -PCP appt made in 2 weeks

## 2016-09-25 NOTE — Assessment & Plan Note (Signed)
  Not currently taking statin due to running out  -refilled atorvastatin -scheduled follow up with PCP in 2 weeks

## 2016-09-25 NOTE — Progress Notes (Signed)
    Subjective:    Patient ID: Nancy Gilbert, female    DOB: [birth year], 56 y.o.   MRN: [id number]  CC: headache, shoulder pain  HA Reports headaches almost daily that she describes as strong pain and pressure behind her eyes and in her temples. She has tried tylenol and ibuprofen with no relief. She wears glasses but does not see an eye doctor and has not for some time now. Denies changes in vision with these headaches. [place] in [place] months this winter and had headaches, was seen by doctor and told her blood pressure was too low. She is currently taking enalapril-hctz daily. Of note she is no longer taking her cholesterol medication as she ran out.   Shoulder pain [place] a tear in her rotator cuff. She had been followed by doctor there and has done PT in past, was told she may need surgery on shoulder in future. The pain is currently in her right shoulder and tylenol,ibuprofen not helping. It has been painful for the past 2 weeks. She denies overuse to incite pain. She has good ROM but pain with certain movements. She is here in [place] now for good and would like to follow with a doctor again for the shoulder.  Smoking status reviewed- non-smoker  Review of Systems- see HPI   Objective:  BP 128/90   Pulse 78   Temp 98.2 F (36.8 C) (Oral)   Wt 161 lb (73 kg)   BMI 786.08 kg/m  Vitals and nursing note reviewed  Visual acuity 20/20 bilaterally- corrected  General: well nourished, in no acute distress HEENT: normocephalic, PERRLA. EOMI. Oropharynx clear. Good dentition. No pain with palpation of sinuses, tender to palpation along temporal muscles bilaterally Neck: tender to palpation along cervical muscles especially posteriorly, no LAD. Cardiac: RRR, clear S1 and S2, no murmurs, rubs, or gallops. +2 radial pulses bilaterally. Respiratory: clear to auscultation bilaterally, no increased work of  breathing Extremities: tender to palpation anterior portion of right shoulder. Limited AROM in internal and external rotation of right shoulder. Left shoulder non-tender with normal ROM. Neuro: alert and oriented, no focal deficits  Assessment & Plan:    Mixed hyperlipidemia  Not currently taking statin due to running out  -refilled atorvastatin -scheduled follow up with PCP in 2 weeks  Tear of right rotator cuff  Chronic, had been followed in [place] for PT and was told she may need surgery in future. Has images on CDs with her  -referral to sports medicine made today  Acute intractable tension-type headache  Acute on chronic problem. Has headaches chronically but cannot get rid of this one. Neuro exam normal. Visual acuity in tact.  -rx given for excedrin migraine to try -follow up if worsens -PCP appt made in 2 weeks  Blurred vision, bilateral  Wears glasses, [place] doctor [place] [facility] [facility] [place] [facility]  -referral made to ophtho    Return in about 2 weeks (around 10/09/2016).   Dolores PattyAngela Riccio, DO Family Medicine Resident PGY-1

## 2016-10-10 ENCOUNTER — Ambulatory Visit: Payer: BLUE CROSS/BLUE SHIELD | Admitting: Family Medicine

## 2016-10-15 ENCOUNTER — Encounter: Payer: Self-pay | Admitting: Family Medicine

## 2016-10-15 ENCOUNTER — Ambulatory Visit (INDEPENDENT_AMBULATORY_CARE_PROVIDER_SITE_OTHER): Payer: BLUE CROSS/BLUE SHIELD | Admitting: Family Medicine

## 2016-10-15 VITALS — BP 129/80 | HR 80 | Temp 98.3°F | Ht 61.0 in | Wt 161.2 lb

## 2016-10-15 DIAGNOSIS — I10 Essential (primary) hypertension: Secondary | ICD-10-CM

## 2016-10-15 DIAGNOSIS — N898 Other specified noninflammatory disorders of vagina: Secondary | ICD-10-CM

## 2016-10-15 LAB — POCT WET PREP (WET MOUNT)
Clue Cells Wet Prep Whiff POC: NEGATIVE
Trichomonas Wet Prep HPF POC: ABSENT

## 2016-10-15 NOTE — Patient Instructions (Addendum)
Gracias por venir a vernos hoy. Consulte a continuacin para revisar nuestro plan para la visita de hoy.  1. Su presin arterial est bien controlada hoy. Contine tomando su enalapril-HCTZ segn las indicaciones. Me alegra que te sientas mejor desde que comenzaste la atorvastatina. Contine tomando esto junto con su aspirina a diario. 2. Usted est retrasado para su colonoscopa. Le he dado informacin para que llame y programe una colonoscopia de deteccin. 3. No hay hallazgos en su examen con respecto a su alta. 4. Puede continuar tomando Excedrin sin receta segn sea necesario para dolores de Turkmenistancabeza. 5. Tiene una cita con medicina deportiva el 6/18 a las 2:30 PM con el Dr. Margaretha Sheffieldraper. Si tiene preguntas, puede llamar al [phone]. 6. Regrese a la clnica en 1 mes para el seguimiento del dolor en el hombro derecho.  Llame a la clnica al [phone] si sus sntomas empeoran o si tiene alguna inquietud. Fue Actorun placer verte. - Durward Parcelavid Quamir Willemsen, DO [facility], PGY-1    Thank you for coming in to see us today. Please see below to review our plan for today's visit.  1. Her blood pressure is well controlled today. Please continue taking your enalapril-HCTZ as directed. I am glad you are feeling better since starting the atorvastatin. Continue taking this along with your aspirin daily. 2. You are overdue for your colonoscopy. Have given you information for you to call and schedule a screening colonoscopy. 3. There are no findings on your exam concerning your discharge. 4. You can continue taking Excedrin over-the-counter as needed for headaches. 5. You have an appointment with sports medicine on 6/18 at 2:30 PM with Dr. Margaretha Sheffieldraper. If you have questions, you can call [phone]. 6. Return to the clinic in 1 months for right shoulder pain follow up.   Please call the clinic at [phone] if your symptoms worsen or you have any concerns. It was my pleasure to see  you. -- Durward Parcelavid Junelle Hashemi, DO [facility], PGY-1

## 2016-10-15 NOTE — Assessment & Plan Note (Addendum)
Chronic. Control. On enalapril-HCTZ. Never smoker. BP controlled at 129/80 in office today. Headaches resolved. Normal TSH 02/2016. Creatinine 0.92 on 02/2016. --Continue enalapril-HCTZ 10-25 mg daily, atorvastatin 20 mg daily, aspirin 81 mg daily

## 2016-10-15 NOTE — Progress Notes (Signed)
   Subjective:   Patient ID: Nancy Gilbert    DOB: May 18, 1960, 56 y.o. female   MRN: [id number]  CC: "High blood pressure"  HPI: Nancy Gilbert is a 56 y.o. female who presents to clinic today for high blood pressure. Problems discussed today are as follows:  High blood pressure: Patient was recently seen for acute intractable tension type headache on 5/15. States her blood pressure was elevated and was told to return to clinic. Currently on enalapril-HCTZ and really misses a dose. States her headaches have resolved. Also taking atorvastatin and aspirin daily.  Vaginal discharge: Patient concerned due to increase "sweating and vaginal area." Patient states this feels as if it is in her vagina. Onset 3 months ago. States she has had 1 sexual partner. Vaginal discharge is clear per patient. ROS: Denies fever or chills, pruritus, foul-smelling odor, purulent or cottage cheese like discharge.  Complete ROS performed, see HPI for pertinent.  PMFSH: HTN, HLD, R-rotator cuff tear. Tubal ligation. Mother and father h/o heart disease, DM, HTN, kidney disease, sister has DM and depression. Smoking status reviewed. Medications reviewed.  Objective:   BP 129/80 (BP Location: Left Arm, Patient Position: Sitting, Cuff Size: Normal)   Pulse 80   Temp 98.3 F (36.8 C) (Oral)   Ht 5\' 1"  (1.549 m)   Wt 161 lb 3.2 oz (73.1 kg)   SpO2 97%   BMI 30.46 kg/m  Vitals and nursing note reviewed.  General: well nourished, well developed, in no acute distress with non-toxic appearance HEENT: normocephalic, atraumatic, moist mucous membranes Neck: supple, non-tender without lymphadenopathy CV: regular rate and rhythm without murmurs, rubs, or gallops, no lower extremity edema Lungs: clear to auscultation bilaterally with normal work of breathing Abdomen: soft, non-tender, non-distended, no masses or organomegaly palpable, normoactive bowel sounds GU: no external lesions or masses  appreciated, no increase mucus or purulence within vaginal vault, no bleeding and vaginal wall, no adnexal tenderness Skin: warm, dry, no rashes or lesions, cap refill < 2 seconds Extremities: warm and well perfused, normal tone  Assessment & Plan:   Vaginal discharge Acute. Normal wet prep on exam. No risk factors for STDs. No obvious signs of infection on exam. Normal amount of mucus in vaginal vault. --Reassured patient this sensation is likely not due to an infection  Primary hypertension Chronic. Control. On enalapril-HCTZ. Never smoker. BP controlled at 129/80 in office today. Headaches resolved. Normal TSH 02/2016. Creatinine 0.92 on 02/2016. --Continue enalapril-HCTZ 10-25 mg daily, atorvastatin 20 mg daily, aspirin 81 mg daily  Orders Placed This Encounter  Procedures  . POCT Wet Prep [facility]([place])   No orders of the defined types were placed in this encounter.   Durward Parcelavid McMullen, DO [facility], PGY-1 10/15/2016 4:16 PM

## 2016-10-15 NOTE — Assessment & Plan Note (Addendum)
Acute. Normal wet prep on exam. No risk factors for STDs. No obvious signs of infection on exam. Normal amount of mucus in vaginal vault. --Reassured patient this sensation is likely not due to an infection

## 2016-10-29 ENCOUNTER — Ambulatory Visit: Payer: BLUE CROSS/BLUE SHIELD | Admitting: Sports Medicine

## 2016-11-19 ENCOUNTER — Encounter: Payer: Self-pay | Admitting: Sports Medicine

## 2016-11-19 ENCOUNTER — Ambulatory Visit (INDEPENDENT_AMBULATORY_CARE_PROVIDER_SITE_OTHER): Payer: BLUE CROSS/BLUE SHIELD | Admitting: Sports Medicine

## 2016-11-19 VITALS — BP 120/84 | Ht 61.0 in | Wt 161.0 lb

## 2016-11-19 DIAGNOSIS — M75101 Unspecified rotator cuff tear or rupture of right shoulder, not specified as traumatic: Secondary | ICD-10-CM | POA: Diagnosis not present

## 2016-11-19 MED ORDER — DICLOFENAC SODIUM 75 MG PO TBEC: 75.0000 mg | DELAYED_RELEASE_TABLET | Freq: Two times a day (BID) | ORAL | 1 refills | Status: DC

## 2016-11-19 NOTE — Progress Notes (Signed)
   Subjective:    Patient ID: Nancy Gilbert, female    DOB: [birth year], 58 y.o.   MRN: [id number]  HPI chief complaint: Right shoulder pain  56 year old female comes in today complaining of 1 year of right shoulder pain. She does not recall any specific trauma rather describes a gradual onset of pain that is intermittent in nature. She is originally from the [place] and was treated there with multiple cortisone injections, medications, and physical therapy. An MRI was also done but I am unable to review that study on our computer today. She was told that she had a rotator cuff tear that may need surgery. Her pain is primarily along the lateral shoulder but at times will radiate into her forearm where she also notices intermittent swelling. Today the pain isn't quite so bad. When she has pain, it limits her range of motion. She also has some pain at night. She is not currently taking any medication. She does feel like physical therapy was beneficial. She denies any weakness. Entire history is obtained with help of an interpreter.  Past medical history reviewed Medications reviewed Allergies reviewed    Review of Systems    as above Objective:   Physical Exam  Well-developed, well-nourished. No acute distress. Sitting comfortably in exam room  Right shoulder: Patient has good active and passive range of motion. She is tender to palpation over the bicipital groove. Negative empty can, negative Hawkins. Rotator cuff strength is 5/5 and does not seem to reproduce pain. No tenderness to palpation over the acromioclavicular joint.  Right forearm: There is some mild diffuse soft tissue swelling in the distal third of the dorsal forearm. It is not tender to palpation. No crepitus. No pain with active or passive wrist range of motion. No erythema. Neurovascularly intact distally.      Assessment & Plan:   Right shoulder pain likely secondary to rotator cuff tendinopathy  versus partial thickness rotator cuff tear Right forearm swelling-question tendinitis  For both her shoulder and her right forearm, I've prescribed diclofenac 75 mg with instructions to take it twice daily with food as needed. Since she did get some initial improvement with physical therapy in the [place], I will refer her for more physical therapy [place]. She will follow-up with me in 2 months for reevaluation. If symptoms persist or worsen, then I would consider getting updated imaging before deciding on further treatment. Patient is encouraged to call with questions or concerns prior to her follow-up visit.

## 2017-01-22 ENCOUNTER — Ambulatory Visit (INDEPENDENT_AMBULATORY_CARE_PROVIDER_SITE_OTHER): Payer: BLUE CROSS/BLUE SHIELD | Admitting: Sports Medicine

## 2017-01-22 VITALS — BP 118/84 | Ht 65.0 in | Wt 165.2 lb

## 2017-01-22 DIAGNOSIS — M25539 Pain in unspecified wrist: Secondary | ICD-10-CM | POA: Diagnosis not present

## 2017-01-22 DIAGNOSIS — M75101 Unspecified rotator cuff tear or rupture of right shoulder, not specified as traumatic: Secondary | ICD-10-CM

## 2017-01-22 MED ORDER — PREDNISONE 10 MG PO TABS: ORAL_TABLET | ORAL | 0 refills | Status: DC

## 2017-01-23 NOTE — Progress Notes (Signed)
   Subjective:    Patient ID: Nancy Gilbert, female    DOB: 28-Feb-1961, 56 y.o.   MRN: [id number]  HPI   Patient comes in today for follow-up on right shoulder pain. She is still having pain but has not started physical therapy. She has a documented history of a rotator cuff tear diagnosed in the [place]. She is also complaining of diffuse pain in both of her forearms. She's noticed some swelling in the right forearm. Upon further questioning she seems to have pain throughout her entire body. History is obtained with the help of an interpreter.    Review of Systems As above    Objective:   Physical Exam  Well-developed, well-nourished. No acute distress  Right shoulder: Patient still demonstrates good range of motion. She does have pain with rotator cuff stressing. No weakness. Forearms: There is a slight palpable mass on the distal right forearm. It is nontender. Nonerythematous. No other swelling. Neurovascular intact distally. Left forearm shows no swelling. No tenderness to palpation.      Assessment & Plan:   Right shoulder pain secondary to rotator cuff tear Bilateral forearm pain  Patient is concerned about some sort of bony abnormality in her forearm. I'll get x-rays of both forearms to evaluate. I think the mass in the right forearm is a simple lipoma. A would like to try her on a 6 day Sterapred Dosepak and see her back in the office in 2 weeks. May need to consider further diagnostic imaging of her right shoulder since I cannot view the MRI done at the [place]. She may also have an element of fibromyalgia but we'll see how she responds to the prednisone.

## 2017-01-28 ENCOUNTER — Ambulatory Visit
Admission: RE | Admit: 2017-01-28 | Discharge: 2017-01-28 | Disposition: A | Discharge status: Home / Self Care | Payer: BLUE CROSS/BLUE SHIELD | Source: Ambulatory Visit | Attending: Sports Medicine | Admitting: Sports Medicine

## 2017-01-28 DIAGNOSIS — M25539 Pain in unspecified wrist: Secondary | ICD-10-CM

## 2017-02-05 ENCOUNTER — Ambulatory Visit: Payer: BLUE CROSS/BLUE SHIELD | Admitting: Sports Medicine

## 2017-02-11 NOTE — Progress Notes (Signed)
   Subjective:   Patient ID: Nancy Gilbert    DOB: April 20, 1961, 56 y.o. female   MRN: [id number]  CC: "Neck and arm pain"  HPI: Nancy Gilbert de Gretta Arab is a 56 y.o. female who presents for a same day appointment concerning neck and arm pain. Problems discussed today are as follows:  HEADACHE  Headache started 15 days ago Pain is intermittent lasting 20 minutes Severity:  Unable to describe, not worse headache of life Location: posterior neck and behind eyes Medications tried: Excedrin which significant helps pain Head trauma: No Sudden onset: No Previous similar headaches: Yes Taking blood thinners: No History of cancer: No  Symptoms Nose congestion stuffiness:  No Nausea vomiting: No Photophobia: No Noise sensitivity: No Double vision or loss of vision: No Fever: No Neck Stiffness: No, but pain radiates to posterior neck Trouble walking or speaking: No  Of note, patient also experiencing tachycardia And feelings of palpitations.  Complete ROS performed, see HPI for pertinent.  PMFSH: HTN, HLD, R-rotator cuff tear. Surgical history tubal. Family history heart disease, CKD, HTN, HLD, depression. Smoking status reviewed. Medications reviewed.  Objective:   BP 108/78   Pulse 81   Temp 98.4 F (36.9 C) (Oral)   Ht  (1.651 m)   Wt 163 lb 6.4 oz (74.1 kg)   SpO2 98%   BMI 27.19 kg/m  Vitals and nursing note reviewed.  General: well nourished, well developed, in no acute distress with non-toxic appearance HEENT: normocephalic, atraumatic, moist mucous membranes, PERRLA, EOMI Neck: supple, non-tender without lymphadenopathy, full active ROM of neck CV: regular rate and rhythm without murmurs, rubs, or gallops, no lower extremity edema Lungs: clear to auscultation bilaterally with normal work of breathing Skin: warm, dry, no rashes or lesions, cap refill < 2 seconds Extremities: warm and well perfused, normal tone Neuro: CNII-XII intact, no slurring  of speech  Assessment & Plan:   Tension headache, chronic Acute on chronic. Symptoms consistent with tension. Does not seem migrainous. No red flags. EKG NSR. --Recommending discontinuation of Excedrin in prescribing Tylenol 500 mg q6h PRN for headache along with Firoicet 50-300-40 mg q4h PRN for severe pain --Consider sending patient to headache clinic if continues to have problems  Orders Placed This Encounter  Procedures  . EKG 12-Lead   Meds ordered this encounter  Medications  . Butalbital-APAP-Caffeine (FIORICET) 50-300-40 MG CAPS    Sig: Take 1-2 capsules every 4 hours as needed for severe headaches.    Dispense:  84 capsule    Refill:  0  . acetaminophen (TYLENOL) 500 MG tablet    Sig: Take 1 tablet (500 mg total) by mouth every 6 (six) hours as needed.    Dispense:  120 tablet    Refill:  3    Durward Parcel, DO [facility], PGY-2 02/13/2017 3:06 PM

## 2017-02-12 ENCOUNTER — Ambulatory Visit (HOSPITAL_COMMUNITY)
Admission: RE | Admit: 2017-02-12 | Discharge: 2017-02-12 | Disposition: A | Discharge status: Home / Self Care | Payer: BLUE CROSS/BLUE SHIELD | Source: Ambulatory Visit | Attending: Family Medicine | Admitting: Family Medicine

## 2017-02-12 ENCOUNTER — Ambulatory Visit (INDEPENDENT_AMBULATORY_CARE_PROVIDER_SITE_OTHER): Payer: BLUE CROSS/BLUE SHIELD | Admitting: Family Medicine

## 2017-02-12 ENCOUNTER — Encounter: Payer: Self-pay | Admitting: Family Medicine

## 2017-02-12 VITALS — BP 108/78 | HR 81 | Temp 98.4°F | Ht 65.0 in | Wt 163.4 lb

## 2017-02-12 DIAGNOSIS — R002 Palpitations: Secondary | ICD-10-CM

## 2017-02-12 DIAGNOSIS — G44229 Chronic tension-type headache, not intractable: Secondary | ICD-10-CM | POA: Diagnosis not present

## 2017-02-12 MED ORDER — BUTALBITAL-APAP-CAFFEINE 50-300-40 MG PO CAPS: ORAL_CAPSULE | ORAL | 0 refills | Status: DC

## 2017-02-12 MED ORDER — ACETAMINOPHEN 500 MG PO TABS: 500.0000 mg | ORAL_TABLET | Freq: Four times a day (QID) | ORAL | 3 refills | Status: DC | PRN

## 2017-02-12 NOTE — Assessment & Plan Note (Addendum)
Acute on chronic. Symptoms consistent with tension. Does not seem migrainous. No red flags. EKG NSR. --Recommending discontinuation of Excedrin in prescribing Tylenol 500 mg q6h PRN for headache along with Firoicet 50-300-40 mg q4h PRN for severe pain --Consider sending patient to headache clinic if continues to have problems

## 2017-02-12 NOTE — Patient Instructions (Signed)
Gracias por venir a vernos hoy. Consulte a continuacin para revisar nuestro plan para la visita de hoy.  1. He prescrito debido a medicamentos para sus dolores de [place]. Descontinuar el Excedrin hoy. Tome el acetaminofeno (Tylenol) para sus dolores de [place] y use Fioricet solo cuando tenga dolores de cabeza intensos. 2. Si tiene algn cambio en su funcin motora, cambios en los sentimientos, cambios en el habla, cambios en la visin, busque atencin mdica inmediata.  Llame a la clnica al [phone] si sus sntomas empeoran o si tiene alguna inquietud. Fue Actor. - Durward Parcel, DO [facility], PGY-2  Thank you for coming in to see [place] today. Please see below to review our plan for today's visit.  1. I have prescribed due to medications for your headaches. Discontinue the Excedrin today. Take the acetaminophen (Tylenol) for your headaches and use the Fioricet only when you have any severe headaches. 2. If you have any changes in her motor function, changes in feeling, change in speech, change in vision, seek immediate medical attention.   Please call the clinic at [phone] if your symptoms worsen or you have any concerns. It was my pleasure to see you. -- Durward Parcel, DO [facility], PGY-2

## 2017-02-15 ENCOUNTER — Ambulatory Visit (INDEPENDENT_AMBULATORY_CARE_PROVIDER_SITE_OTHER): Payer: BLUE CROSS/BLUE SHIELD | Admitting: Family Medicine

## 2017-02-15 ENCOUNTER — Ambulatory Visit
Admission: RE | Admit: 2017-02-15 | Discharge: 2017-02-15 | Disposition: A | Discharge status: Home / Self Care | Payer: BLUE CROSS/BLUE SHIELD | Source: Ambulatory Visit | Attending: Sports Medicine | Admitting: Sports Medicine

## 2017-02-15 VITALS — BP 108/80 | Ht 65.0 in | Wt 164.8 lb

## 2017-02-15 DIAGNOSIS — M25512 Pain in left shoulder: Secondary | ICD-10-CM

## 2017-02-15 DIAGNOSIS — M75101 Unspecified rotator cuff tear or rupture of right shoulder, not specified as traumatic: Secondary | ICD-10-CM

## 2017-02-15 NOTE — Progress Notes (Signed)
Chief complaint: Chronic right shoulder pain x 1 year, left shoulder pain x 2 weeks  History of present illness: Nancy Gilbert is a 56 year old female who presents to the sports medicine office today with chief complaint of bilateral shoulder pain, seems to be acute on chronic in nature. She has been mainly having right shoulder pain which has been ongoing for the last year, but reports over the last 2 weeks has had gradual and progressive worsening of left shoulder pain. She is Spanish-speaking only, Spanish interpreter is present the room at time of evaluation today and translates for entire duration of history and physical examination today. She has been seen here a couple times for right shoulder pain over the last few months, last visit here was back on 01/22/17. She reports her right shoulder pain being along the posterolateral aspect of her right shoulder, also reports similar symptoms in her left shoulder today. She is right hand dominant. 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 Apparently, she has had a documented history of a rotator cuff tear diagnosed by MRI in the [place]. MRI is not available for [place] to review today. With any type of shoulder movement in both shoulders she has had pain. Today, she describes the pain as 10/10, describes the pain as both sharp and throbbing. On further clarification, she reports not having so much in the way of right shoulder pain today as much as having right upper arm pain. Previously, she did have bilateral forearm pain and swelling, worse today feeling the same symptoms. X-ray was done last month which did not show any acute abnormalities. Was given a six-day Medrol Dosepak, she reports having pain relief only for a few days.  She reports pain in her left neck, posterolateral aspect of left shoulder, with burning and sharp paresthesias radiating down the lateral aspect and posterior aspect of her left arm all the way to her left third digit on the dorsal  aspect. She reports any type of movement in her left shoulder causes pain. Since last appointment, she does not report of any interval injury or trauma. She does not report of any numbness or tingling. Does not report of any weakness in her arms or her hands.  Review of systems:  As stated above  Interval past medical history, surgical history, family history, and social history obtained and unchanged.  Physical exam: Vital signs are reviewed and are documented in the chart Gen.: Alert, oriented, appears stated age, in no apparent distress HEENT: Moist oral mucosa Respiratory: Normal respirations, able to speak in full sentences Cardiac: Regular rate, distal pulses 2+ Integumentary: No rashes on visible skin:  Neurologic: She is not able to fully participate in strength testing, not sure if language barrier applied, as interpreter did ask her to resist but didn't seem like she did much in the way of resisting, did not grimace at any point during attempted strength testing, sensation intact in bilateral upper extremities Psych: Normal affect, mood is described as good Musculoskeletal: Inspection of her right shoulder reveals no obvious deformity or muscle atrophy, no warmth, erythema, ecchymosis, or effusion noted, no tenderness to palpation along distal clavicle, AC and, acromion, coracoid, head of biceps, or scapular spine, slight tenderness to palpation on lateral aspect over the deltoid muscle, she does have full range of motion of her right shoulder without reproduction of pain, she does report of pain when I do empty can testing as well as crossarm testing on the right side, Neers, Hawkins, speed and [employer]  negative, inspection of her left shoulder reveals no obvious deformity or muscle atrophy, no warmth, erythema, ecchymosis, or effusion noted, no tenderness to palpation along distal clavicle, AC and, acromion, coracoid, head of biceps, or scapular spine, similarly, she has slight  tenderness to palpation on lateral aspect over the deltoid muscle, she also has tenderness to palpation over the paraspinal process of the left side of the cervical spine, Spurling's test does reproduce the sharp paresthesias on the left side, empty can and cross arm test positive on the left side, Neer's, Hawkins, Speed, and Yergason negative  Assessment and plan: 1. Right shoulder pain, suspect from rotator cuff tendinopathy versus fibromyalgia  2. Left shoulder pain, suspect referred from neck, question whether she has cervical radiculopathy  Right shoulder pain -Does not seem to be bothering her much today, did not seem to respond much to Medrol Dosepak -Rotator cuff testing today equovical, does have history of rotator cuff tear diagnosed by MRI in the [place] -Will order for x-ray of her right shoulder today, including AP, axillary, outlet, and Grashey may need more advanced imaging such as MRI, could consider ultrasound at next appointment  Left shoulder pain -Seems to be referred from her neck, with Spurling being positive, pain being on posterolateral aspect of her left shoulder with symptoms being inserting for cervical radiculopathy, question whether symptoms are from C6-7 -Will order for x-ray of her cervical spine today, including AP and lateral views  Question also if she has an element of fibromyalgia. Could consider starting her on amitriptyline 25 mg nightly, improving her sleep pattern seeing at this helps her symptoms, as this seems like she does have an element of chronic pain.  Due to language barrier, will have her follow up in office after x-rays are done to discuss next steps in plan of care.   Haynes Kerns, M.D. Primary Care Sports Medicine Fellow [facility]

## 2017-02-15 NOTE — Patient Instructions (Signed)
Make appointment after XR completed.

## 2017-02-19 ENCOUNTER — Ambulatory Visit: Payer: BLUE CROSS/BLUE SHIELD | Admitting: Family Medicine

## 2017-02-20 ENCOUNTER — Ambulatory Visit (INDEPENDENT_AMBULATORY_CARE_PROVIDER_SITE_OTHER): Payer: BLUE CROSS/BLUE SHIELD | Admitting: Family Medicine

## 2017-02-20 VITALS — BP 106/72 | Ht 65.0 in | Wt 163.0 lb

## 2017-02-20 DIAGNOSIS — M5030 Other cervical disc degeneration, unspecified cervical region: Secondary | ICD-10-CM

## 2017-02-20 DIAGNOSIS — M25511 Pain in right shoulder: Secondary | ICD-10-CM

## 2017-02-20 MED ORDER — AMITRIPTYLINE HCL 25 MG PO TABS: 25.0000 mg | ORAL_TABLET | Freq: Every day | ORAL | 1 refills | Status: DC

## 2017-02-20 NOTE — Progress Notes (Signed)
Chief complaint: Discussion of x-ray results, history of chronic right shoulder pain 1 year, left shoulder pain 3 weeks  History of present illness: Nancy Gilbert is a 56 year old female who presents to the sports medicine office today for discussion of x-ray results that were done after last office visit back on 02/15/17. Of note, she is Spanish-speaking only, Spanish interpreter is present in the room for entire duration of examination today and interprets for me. She did have x-ray of her cervical spine and her right shoulder done on the same day (02/15/17), which x-ray of her cervical spine showed that she does have multilevel degenerative disc disease, specifically at C5-6, with inferior osteophytes and decreased disc space in this region. She also has multilevel facet arthropathy, specifically at C5-6 as well. She reports that her left posterior shoulder pain has slightly improved, does report of intermittent sharp shooting pains intermittently, she reports any type of shoulder movement as aggravating factors. She does report since last week have a slightly decreased pain in her right shoulder as well. She also points to the posterolateral aspect of her right shoulder as where pain is at. No interval injury or trauma. No numbness, burning, or tingling paresthesias.  Review of systems:  As stated above  Interval past medical history, surgical history, family history, and social history obtained and unchanged.  Physical exam: Vital signs are reviewed and are documented in the chart Gen.: Alert, oriented, appears stated age, in no apparent distress HEENT: Moist oral mucosa Respiratory: Normal respirations, able to speak in full sentences Cardiac: Regular rate, distal pulses 2+ Integumentary: No rashes on visible skin:  Neurologic: Somewhat to last week, she is not able to fully participate in strength testing, not sure if lingers barriers factor here, but on both sides her effort was submaximal, did  not grimace at any point during attempted strength testing, sensation intact in bilateral upper extremities Psych: Normal affect, mood is described as good Musculoskeletal: Similar to last week's examination, inspection of her right shoulder reveals no obvious deformity or muscle atrophy, no warmth, erythema, ecchymosis, or effusion noted, no tenderness to palpation along distal clavicle, AC and, acromion, coracoid, head of biceps, or scapular spine, slight tenderness to palpation on lateral aspect over the deltoid muscle, she does have full range of motion of her right shoulder without reproduction of pain, she still does report of pain when I do empty can testing as well as crossarm testing on the right side, Neers, Hawkins, speed and Yergason negative, inspection of her left shoulder reveals no obvious deformity or muscle atrophy, no warmth, erythema, ecchymosis, or effusion noted, no tenderness to palpation along distal clavicle, AC and, acromion, coracoid, head of biceps, or scapular spine, similarly, she has slight tenderness to palpation on lateral aspect over the deltoid muscle, she also has tenderness to palpation over the paraspinal process of the left side of the cervical spine, Spurling's test does reproduce the sharp paresthesias on the left side, empty can and cross arm test positive on the left side, Neer's, Hawkins, Speed, and Yergason negative   Assessment and plan: 1. Right shoulder pain, suspect rotator cuff tendinopathy versus partial degenerative rotator cuff tearing versus fibromyalgia 2. Left shoulder pain, with x-ray evidence of degenerative disc disease most notably at C5-6  Left shoulder pain -I reviewed XR results with patient today, discussed most likely it is coming from cervical radiculopathy and degenerative disc disease with osteophytes seen at C5-6 -I'll have her referred to physiatry for discussion of ESI as option  for pain management  Right shoulder pain -Again, she  reports of slightly decreased pain today compared to when I saw her last week -Her right shoulder x-ray results that show that she has AC arthropathy, but I suspect this is incidental finding as I suspect she has more rotator cuff issues rather than true osteoarthritis, as her pain is more global in right shoulder than what would be suspected if she had isolated AC osteoarthritis -I discussed MRI is not needed at this time since surgery is not a discussion, given diffuse pains that she has had do feel that there may be element of fibromyalgia, we'll have her started on amitriptyline 25 mg nightly, discussed side effects of somnolence and dry mouth  She is instructed to follow up if no improvement in symptoms or worsening after 4-6 weeks. Otherwise, she will follow up as needed.   Haynes Kerns, M.D. Primary Care Sports Medicine Fellow [facility]

## 2017-02-20 NOTE — Patient Instructions (Addendum)
[facility] [address] [place] [phone] Dr. Josepha Pigg  Thursday November 8th at 9:15 am

## 2017-04-17 ENCOUNTER — Other Ambulatory Visit: Payer: Self-pay | Admitting: Family Medicine

## 2017-04-17 ENCOUNTER — Telehealth: Payer: Self-pay | Admitting: Family Medicine

## 2017-04-17 DIAGNOSIS — I10 Essential (primary) hypertension: Secondary | ICD-10-CM

## 2017-04-17 DIAGNOSIS — E782 Mixed hyperlipidemia: Secondary | ICD-10-CM

## 2017-04-17 MED ORDER — ATORVASTATIN CALCIUM 20 MG PO TABS: 20.0000 mg | ORAL_TABLET | Freq: Every day | ORAL | 2 refills | Status: DC

## 2017-04-17 MED ORDER — ENALAPRIL-HYDROCHLOROTHIAZIDE 10-25 MG PO TABS: 1.0000 | ORAL_TABLET | Freq: Every day | ORAL | 2 refills | Status: DC

## 2017-04-17 NOTE — Telephone Encounter (Signed)
Patient came to office request refill on her bp meds. [facility]. Patient needs this refill as soon as possible due to leaving country on Sat. Please call patient [phone]

## 2017-07-25 ENCOUNTER — Encounter: Payer: Self-pay | Admitting: Internal Medicine

## 2017-07-25 ENCOUNTER — Ambulatory Visit: Payer: BLUE CROSS/BLUE SHIELD | Admitting: Internal Medicine

## 2017-07-25 VITALS — BP 102/80 | HR 76 | Temp 98.1°F | Ht 65.0 in | Wt 163.8 lb

## 2017-07-25 DIAGNOSIS — J029 Acute pharyngitis, unspecified: Secondary | ICD-10-CM

## 2017-07-25 DIAGNOSIS — I10 Essential (primary) hypertension: Secondary | ICD-10-CM | POA: Diagnosis not present

## 2017-07-25 LAB — POCT RAPID STREP A (OFFICE): Rapid Strep A Screen: NEGATIVE

## 2017-07-25 MED ORDER — GUAIFENESIN-DM 100-10 MG/5ML PO SYRP: 5.0000 mL | ORAL_SOLUTION | ORAL | 0 refills | Status: DC | PRN

## 2017-07-25 MED ORDER — FLUTICASONE PROPIONATE 50 MCG/ACT NA SUSP: 2.0000 | Freq: Every day | NASAL | 6 refills | Status: DC

## 2017-07-25 MED ORDER — ENALAPRIL-HYDROCHLOROTHIAZIDE 10-25 MG PO TABS: 1.0000 | ORAL_TABLET | Freq: Every day | ORAL | 0 refills | Status: DC

## 2017-07-25 NOTE — Patient Instructions (Addendum)
Please take the nasal spray for your congestion and Robitussin for your cough. Please follow up in 1 week if not better,

## 2017-07-25 NOTE — Assessment & Plan Note (Signed)
Likely viral URI, strep negative.  Patient has recently traveled to [place] therefore if she continues to have significant fevers would consider further workup - POCT rapid strep A - fluticasone (FLONASE) 50 MCG/ACT nasal spray; Place 2 sprays into both nostrils daily.  Dispense: 16 g; Refill: 6 - guaiFENesin-dextromethorphan (ROBITUSSIN DM) 100-10 MG/5ML syrup; Take 5 mLs by mouth every 4 (four) hours as needed for cough.  Dispense: 118 mL; Refill: 0

## 2017-07-25 NOTE — Progress Notes (Signed)
   [facility] Noralee CharsAsiyah Phyllistine Domingos, MD Phone: [phone]  Reason For Visit: SDA for URI   # URI  Has been sick for 4 days. Patient has has sore throat, cough, congestion. Patient has had fevers just once. Patient states fever.  Medications tried: Patient took some pills for fever. Not sure of the name.  She did state that she has recently returned from country in [place].  She denies any sick contacts.  She indicates she had a fever couple days ago.  She could not tell me T-max temperature.  She denies any muscle aches.  Symptoms Fever: yes  Sneezing: yes  Scratchy throat: yes  Muscle aches: no  Severe fatigue: yes  Sore throat or swollen glands: yes   ROS see HPI Smoking Status noted  Objective: BP 102/80 (BP Location: Left Arm, Patient Position: Sitting, Cuff Size: Normal)   Pulse 76   Temp 98.1 F (36.7 C) (Oral)   Ht 5\' 5"  (1.651 m)   Wt 163 lb 12.8 oz (74.3 kg)   SpO2 96%   BMI 27.26 kg/m  Gen: NAD, alert, cooperative with exam HEENT: Normal    Neck: No Significant lymphadenopathy palpated    Ears: Tympanic membranes intact, normal light reflex, no erythema, no bulging    Eyes: Conjunctive are within normal limits    Nose: nasal turbinates congested    Throat: moist mucus membranes, erythema noted at the back of the throat, though no tonsillar exudate or swelling Cardio: regular rate and rhythm, S1S2 heard, no murmurs appreciated Pulm: clear to auscultation bilaterally, no wheezes, rhonchi or rales Skin: dry, intact, no rashes or lesions   Assessment/Plan: See problem based a/p  Sore throat Likely viral URI, strep negative.  Patient has recently traveled to [place] therefore if she continues to have significant fevers would consider further workup - POCT rapid strep A - fluticasone (FLONASE) 50 MCG/ACT nasal spray; Place 2 sprays into both nostrils daily.  Dispense: 16 g; Refill: 6 - guaiFENesin-dextromethorphan (ROBITUSSIN DM)  100-10 MG/5ML syrup; Take 5 mLs by mouth every 4 (four) hours as needed for cough.  Dispense: 118 mL; Refill: 0

## 2017-08-12 ENCOUNTER — Other Ambulatory Visit: Payer: Self-pay

## 2017-08-12 ENCOUNTER — Ambulatory Visit: Payer: BLUE CROSS/BLUE SHIELD | Admitting: Family Medicine

## 2017-08-12 ENCOUNTER — Ambulatory Visit (HOSPITAL_COMMUNITY)
Admission: RE | Admit: 2017-08-12 | Discharge: 2017-08-12 | Disposition: A | Discharge status: Home / Self Care | Payer: BLUE CROSS/BLUE SHIELD | Source: Ambulatory Visit | Attending: Family Medicine | Admitting: Family Medicine

## 2017-08-12 ENCOUNTER — Encounter: Payer: Self-pay | Admitting: Family Medicine

## 2017-08-12 VITALS — BP 122/82 | HR 94 | Temp 98.6°F | Wt 165.0 lb

## 2017-08-12 DIAGNOSIS — M79632 Pain in left forearm: Secondary | ICD-10-CM | POA: Diagnosis not present

## 2017-08-12 DIAGNOSIS — R0602 Shortness of breath: Secondary | ICD-10-CM

## 2017-08-12 DIAGNOSIS — M79631 Pain in right forearm: Secondary | ICD-10-CM | POA: Insufficient documentation

## 2017-08-12 MED ORDER — GABAPENTIN 300 MG PO CAPS: 300.0000 mg | ORAL_CAPSULE | Freq: Three times a day (TID) | ORAL | 3 refills | Status: DC

## 2017-08-12 NOTE — Assessment & Plan Note (Addendum)
Acute.  Uncertain etiology.  Seems to affect distal end of biceps tendon bilaterally with left worse than right.  No red flags on MSK exam.  Patient endorsing resolution with the gabapentin.  There may be a component of mechanical stress from overuse.  It is possible there could be a component of fibromyalgia though less likely. - Advised patient not to take prescription medications not provided for her - Initiating gabapentin 300 mg 3 times daily - RTC 1 month

## 2017-08-12 NOTE — Progress Notes (Signed)
Subjective   Patient ID: Nancy Gilbert    DOB: [birth year], 59 y.o. female   MRN: [id number] [employer] interpreter used during encounter: Stephannie Peters [phone] (Spanish)  CC: "Arm pain"  HPI: Nancy Gilbert is a 57 y.o. female who presents to clinic today for the following:  Bilateral forearm pain: Onset 15 days ago with intermittent severity particularly on antecubital region with left side worse than right.  Patient denies prior history of forearm pain but does have history of shoulder pain back in October.  She states this feels different.  She cannot recall any trauma or fall.  She denies any weakness or paresthesia.  She had tried her family members gabapentin with complete resolution of symptoms.  Dyspnea: Intermittent over the last month but has a long history of dyspnea on exertion for the past several years.  She denies having any chest pain and states that her left arm pain is not associated with the shortness of breath.  She states it does not interfere with her ADLs.  She denies any cough, edema, orthopnea, fevers or chills.  ROS: see HPI for pertinent.  PMFSH: HTN, HLD, R-rotator cuff tear. Surgical history tubal. Family history heart disease, CKD, HTN, HLD, depression. Surgical history tubal. Family history heart disease, DM, CKD, HTN, HLD. Smoking status reviewed. Medications reviewed.  Objective   BP 122/82   Pulse 94   Temp 98.6 F (37 C) (Oral)   Wt 165 lb (74.8 kg)   SpO2 97%   BMI 27.46 kg/m  Vitals and nursing note reviewed.  General: well nourished, well developed, NAD with non-toxic appearance HEENT: normocephalic, atraumatic, moist mucous membranes Neck: supple, non-tender without lymphadenopathy Cardiovascular: regular rate and rhythm without murmurs, rubs, or gallops Lungs: clear to auscultation bilaterally with normal work of breathing Abdomen: soft, non-tender, non-distended, normoactive bowel sounds Skin: warm, dry, no rashes or  lesions, cap refill < 2 seconds Extremities: warm and well perfused, normal tone, no edema MSK: No deformities on arms bilaterally, active and passive range of motion intact bilaterally, 2+ radial pulse bilaterally, 5/5 motor strength in grip strength bilaterally, mild tenderness to antecubital region particularly at distal insertion of the biceps tendon, negative empty can test, neurovascular intact  Assessment & Plan   Pain in both forearms Acute.  Uncertain etiology.  Seems to affect distal end of biceps tendon bilaterally with left worse than right.  No red flags on MSK exam.  Patient endorsing resolution with the gabapentin.  There may be a component of mechanical stress from overuse.  It is possible there could be a component of fibromyalgia though less likely. - Advised patient not to take prescription medications not provided for her - Initiating gabapentin 300 mg 3 times daily - RTC 1 month  Dyspnea Chronic.  Seems to be intermittent.  Does not have associated chest pain or palpitations associated with dyspnea.  Does seem to be more worse with exertion per patient.  Uncertain what the etiology is though does not appear to be impairing patient's daily life.  EKG reassuring in office with NSR and without ST changes or T wave abnormalities.  Low concern for ACS.  No signs of fluid overload. - Reviewed return precautions - May consider PFTs in the future  Orders Placed This Encounter  Procedures  . EKG 12-Lead   Meds ordered this encounter  Medications  . gabapentin (NEURONTIN) 300 MG capsule    Sig: Take 1 capsule (300 mg total) by mouth 3 (three) times  daily.    Dispense:  90 capsule    Refill:  3    Durward Parcelavid Coady Train, DO [facility], PGY-2 08/13/2017, 1:53 PM

## 2017-08-12 NOTE — Patient Instructions (Addendum)
Gracias por venir a vernos hoy. Por favor, vea a continuacin para revisar nuestro plan para la visita de hoy.  1. no estoy seguro de cul es tu dolor de brazo, pero te probaremos en gabapentina.  Usted tomar [employer] Corporationeste medicamento 3 veces al [employer] Worldwideda.  No opere maquinaria pesada o conduzca si esto le hace sentir somnoliento.  Puede tomar la dosis de la maana y de la tarde por la noche si experimenta somnolencia.  Me gustara volver a verte en 1 mes para reevaluar. 2. le haremos seguimiento para una citologa vaginal ms adelante esta semana en 08/16/17 a las 8:30 AM.  Por favor llame a la clnica al [phone] si sus sntomas empeoran o si tiene alguna inquietud. Fue Regulatory affairs officerun placer servirle.  Durward Parcelavid Alton Tremblay, DO [facility], PGY-2     Thank you for coming in to see us today. Please see below to review our plan for today's visit.  1.  I am not certain what your arm pain is from but we will try you on gabapentin.  You will take this medication 3 times daily.  Do not operate heavy machinery or drive if this makes you drowsy.  You can take the morning and afternoon dose in the evening if you do experience drowsiness.  I would like to see you again in 1 month to reassess. 2.  We will have you follow-up for a Pap smear later this week on 08/16/17 at 8:30 am.  Please call the clinic at [phone] if your symptoms worsen or you have any concerns. It was our pleasure to serve you.  Durward Parcelavid Matvey Llanas, DO [facility], PGY-2

## 2017-08-13 NOTE — Assessment & Plan Note (Signed)
Chronic.  Seems to be intermittent.  Does not have associated chest pain or palpitations associated with dyspnea.  Does seem to be more worse with exertion per patient.  Uncertain what the etiology is though does not appear to be impairing patient's daily life.  EKG reassuring in office with NSR and without ST changes or T wave abnormalities.  Low concern for ACS.  No signs of fluid overload. - Reviewed return precautions - May consider PFTs in the future

## 2017-08-16 ENCOUNTER — Other Ambulatory Visit (HOSPITAL_COMMUNITY)
Admission: RE | Admit: 2017-08-16 | Discharge: 2017-08-16 | Disposition: A | Discharge status: Home / Self Care | Payer: BLUE CROSS/BLUE SHIELD | Source: Ambulatory Visit | Attending: Family Medicine | Admitting: Family Medicine

## 2017-08-16 ENCOUNTER — Other Ambulatory Visit: Payer: Self-pay

## 2017-08-16 ENCOUNTER — Ambulatory Visit: Payer: BLUE CROSS/BLUE SHIELD | Admitting: Internal Medicine

## 2017-08-16 ENCOUNTER — Encounter: Payer: Self-pay | Admitting: Internal Medicine

## 2017-08-16 VITALS — BP 102/70 | HR 76 | Temp 98.4°F | Wt 163.0 lb

## 2017-08-16 DIAGNOSIS — Z124 Encounter for screening for malignant neoplasm of cervix: Secondary | ICD-10-CM

## 2017-08-16 NOTE — Assessment & Plan Note (Signed)
Most recent pap in our system was in 2017 and was negative. [place] - Repeat pap performed in clinic today - Will call patient to discuss results.

## 2017-08-16 NOTE — Patient Instructions (Signed)
Fue Psychiatristun placer conocerte! Hoy repetimos su prueba de Papanicolaou y Barrister's clerkla llamar la prxima semana con [facility] resultados.   -Dr. Nancy MarusMayo

## 2017-08-16 NOTE — Progress Notes (Signed)
   [facility] Phone: [phone]  Subjective:  Nancy Gilbert is a 57 year old female presenting to clinic for a pap smear. Her most recent pap in our records was in 2017 and was negative for malignancy and HPV. She states she [place] 3 months ago that showed dysplasia. She was told to have this rechecked when she returned to the [place]. She also had an abnormal pap 5 years ago, but unable to see these records in our system.   ROS: See HPI for pertinent positives and negatives  Past Medical History- HTN, HLD  Family history reviewed for today's visit. No changes.  Social history- patient is a never smoker  Objective: BP 102/70   Pulse 76   Temp 98.4 F (36.9 C) (Oral)   Wt 163 lb (73.9 kg)   SpO2 99%   BMI 27.12 kg/m  Gen: NAD, alert, cooperative with exam GU: External genitalia normal in appearance, vaginal walls with loss of rugae, small amount of clear vaginal discharge present, cervix normal in appearance, bimanual exam without any masses or tenderness.  Assessment/Plan: Encounter for pap: Most recent pap in our system was in 2017 and was negative. [place]. - Repeat pap performed in clinic today - Will call patient to discuss results.   Willadean CarolKaty Mayo, MD PGY-3

## 2017-08-20 ENCOUNTER — Telehealth: Payer: Self-pay | Admitting: Internal Medicine

## 2017-08-20 LAB — CYTOLOGY - PAP
DIAGNOSIS: NEGATIVE
HPV (WINDOPATH): NOT DETECTED

## 2017-08-20 NOTE — Telephone Encounter (Signed)
Called patient via an interpreter to let her know that her pap smear was negative. Patient voiced understanding.  Nancy CarolKaty Lynnda Wiersma, MD PGY-3

## 2017-10-03 ENCOUNTER — Encounter: Payer: Self-pay | Admitting: Family Medicine

## 2017-10-25 ENCOUNTER — Ambulatory Visit: Payer: BLUE CROSS/BLUE SHIELD | Admitting: Family Medicine

## 2017-10-25 ENCOUNTER — Ambulatory Visit (HOSPITAL_COMMUNITY)
Admission: RE | Admit: 2017-10-25 | Discharge: 2017-10-25 | Disposition: A | Discharge status: Home / Self Care | Payer: BLUE CROSS/BLUE SHIELD | Source: Ambulatory Visit | Attending: Family Medicine | Admitting: Family Medicine

## 2017-10-25 ENCOUNTER — Encounter: Payer: Self-pay | Admitting: Family Medicine

## 2017-10-25 ENCOUNTER — Other Ambulatory Visit: Payer: Self-pay

## 2017-10-25 VITALS — BP 110/70 | HR 74 | Temp 98.6°F | Ht 65.0 in | Wt 164.0 lb

## 2017-10-25 DIAGNOSIS — G44229 Chronic tension-type headache, not intractable: Secondary | ICD-10-CM

## 2017-10-25 DIAGNOSIS — R079 Chest pain, unspecified: Secondary | ICD-10-CM | POA: Diagnosis not present

## 2017-10-25 DIAGNOSIS — M79631 Pain in right forearm: Secondary | ICD-10-CM | POA: Diagnosis not present

## 2017-10-25 DIAGNOSIS — M79632 Pain in left forearm: Secondary | ICD-10-CM

## 2017-10-25 DIAGNOSIS — G8929 Other chronic pain: Secondary | ICD-10-CM

## 2017-10-25 DIAGNOSIS — I10 Essential (primary) hypertension: Secondary | ICD-10-CM

## 2017-10-25 DIAGNOSIS — K219 Gastro-esophageal reflux disease without esophagitis: Secondary | ICD-10-CM | POA: Insufficient documentation

## 2017-10-25 DIAGNOSIS — E782 Mixed hyperlipidemia: Secondary | ICD-10-CM | POA: Diagnosis not present

## 2017-10-25 HISTORY — DX: Other chronic pain: G89.29

## 2017-10-25 MED ORDER — ATORVASTATIN CALCIUM 20 MG PO TABS: 20.0000 mg | ORAL_TABLET | Freq: Every day | ORAL | 3 refills | Status: DC

## 2017-10-25 MED ORDER — OMEPRAZOLE 20 MG PO CPDR: 20.0000 mg | DELAYED_RELEASE_CAPSULE | Freq: Every day | ORAL | 3 refills | Status: DC

## 2017-10-25 MED ORDER — METOPROLOL SUCCINATE ER 100 MG PO TB24: 100.0000 mg | ORAL_TABLET | Freq: Every day | ORAL | 3 refills | Status: DC

## 2017-10-25 MED ORDER — ENALAPRIL-HYDROCHLOROTHIAZIDE 10-25 MG PO TABS: 1.0000 | ORAL_TABLET | Freq: Every day | ORAL | 3 refills | Status: DC

## 2017-10-25 NOTE — Assessment & Plan Note (Addendum)
Chronic.  Controlled, 110/70.  Never smoker. - Given refill for enalapril-HCTZ 10-25 mg daily - Initiating Toprol-XL 100 mg daily for chronic chest pain and headache prophylaxis - Checking CBC, CMET, TSH

## 2017-10-25 NOTE — Assessment & Plan Note (Signed)
Chronic.  Atypical.  Does not seem to be exacerbated with exertion or alleviated with rest.  Suspicious for MSK etiology given trigger with gentle movement.  Does have an controlled GERD which may be contributing.  EKG NSR without ST changes or T wave abnormalities.  Patient well-appearing on exam.  Would likely benefit from cardiac stress test.  Well score 0 making PE unlikely.  Heart score 2 though unable to assess troponin level. - Ambulatory referral to cardiology - Initiating Toprol-XL 100 mg daily given history of chronic chest pain and headache  - RTC 1 month to reassess

## 2017-10-25 NOTE — Assessment & Plan Note (Signed)
Chronic.  Uncertain etiology.  Does have some improvement with gabapentin use.  Patient not taking gabapentin 3 times daily as instructed. - Instructed to continue gabapentin 300 mg 3 times daily

## 2017-10-25 NOTE — Patient Instructions (Signed)
Gracias por venir a vernos hoy. Consulte a continuacin para revisar nuestro plan para la visita de hoy.  1. No estoy seguro de cul es la fuente de su dolor de pecho. Su EKG se ve normal. Creo que esto es menos probable que est relacionado con su corazn, sin embargo, me gustara que vea a un cardilogo para una posible prueba de estrs. 2. Te he iniciado con metoprolol-XL para Chief Technology Officerel dolor en el pecho y el dolor de Turkmenistancabeza. [place] 1 tableta de este medicamento diariamente. Podemos reevaluar cmo le est yendo a este medicamento en 1 mes. 3. Discutiremos su falta de memoria en esta prxima cita en 1 mes. 4. Contine con la gabapentina 3 veces al da para el dolor de su brazo. 5. Envi recargas para sus medicamentos para la presin arterial y Print production plannerel colesterol. Asegrese de tomar la atorvastatina junto con su aspirina diariamente. 6. Envi una receta para omeprazol 20 mg al da. 7. Te llamar si alguno de tus resultados de laboratorio es anormal. De lo contrario espera recibir los resultados por correo.  Llame a la clnica al [phone] si sus sntomas empeoran o si tiene alguna inquietud. Fue Regulatory affairs officerun placer servirle.  Durward Parcelavid McMullen, DO [facility], PGY-2    Thank you for coming in to see us today. Please see below to review our plan for today's visit.  1.  I am uncertain what the source of your chest pain is.  Your EKG looks normal.  I think this is less likely related to your heart, however I would like you to see a cardiologist to be evaluated for a possible stress test. 2.  I have started you on metoprolol-XL for your chest pain and headache.  Take 1 tablet of this medication daily.  We can reassess how you are doing on this medication in 1 month.  3.  We will discuss your forgetfulness at this next appointment in 1 month. 4.  Continue the gabapentin 3 times daily for your arm pain. 5.  I sent in refills for your blood pressure and cholesterol medications.  Be sure to take the  atorvastatin along with your aspirin daily. 6.  I sent in a prescription for omeprazole 20 mg daily. 7.  I will call you if any of your lab results are abnormal.  Otherwise expect to receive results in the mail.  Please call the clinic at [phone] if your symptoms worsen or you have any concerns. It was our pleasure to serve you.  Durward Parcelavid McMullen, DO [facility], PGY-2

## 2017-10-25 NOTE — Progress Notes (Signed)
 Subjective   Patient ID: Nancy Gilbert    DOB: 12/31/1960, 57 y.o. female   MRN: [id number]  CC: "Routine check"  HPI: Nancy Gilbert is a 57 y.o. female who presents to clinic today for the following:  Chronic chest pain: Patient endorsing substernal and left-sided chest pain which is intermittent for the past several years.  She states her pain is worse when she moves and she is not having pain at present.  She has not tried any medications to alleviate her pain.  She has no history of heart disease.  She is a non-smoker.  Pain does not seem to be alleviated at rest and will occasionally occur while at rest.  Patient does endorse sensation of tachycardia.  She denies shortness of breath, cough, diaphoresis, nausea or vomiting, or weakness associated with the chest pain.  Hypertension: Patient is here today to follow-up her chronic hypertension.  She needs a refill on her enalapril-hydrochlorothiazide.  She is a non-smoker.  Hyperlipidemia: Patient has not been taking atorvastatin daily.  She is adherent to her daily aspirin.  GERD: Patient was given omeprazole 20 mg while in the [place].  She has not been on reflux medication since arriving to [place].  Her symptoms come and go.  She is currently without burning sensation in her chest.  Bilateral arm pain: No improvement with gabapentin use.  Patient only taking twice daily.  Headache: Patient has chronic headache on bilateral temporal side radiating to bilateral occipital side.  She has been taking Fioricet Excedrin for her pain but has recently run out of medications.   ROS: see HPI for pertinent.  PMFSH: HTN, HLD, R-rotator cuff tear.Surgical history tubal. Family history heart disease, CKD, HTN, HLD, depression. Surgical history tubal. Family history heart disease, DM, CKD, HTN, HLD. Smoking status reviewed. Medications reviewed.  Objective   BP 110/70   Pulse 74   Temp 98.6 F (37 C) (Oral)    Ht 5' 5" (1.651 m)   Wt 164 lb (74.4 kg)   SpO2 97%   BMI 27.29 kg/m  Vitals and nursing note reviewed.  General: well nourished, well developed, NAD with non-toxic appearance, nondiaphoretic HEENT: normocephalic, atraumatic, moist mucous membranes Neck: supple, non-tender without lymphadenopathy, no JVD Cardiovascular: regular rate and rhythm without murmurs, rubs, or gallops Lungs: clear to auscultation bilaterally with normal work of breathing Abdomen: soft, non-tender, non-distended, normoactive bowel sounds Skin: warm, dry, no rashes or lesions, cap refill < 2 seconds Extremities: warm and well perfused, normal tone, no edema Psych: euthymic mood, flat affect  Assessment & Plan   Primary hypertension Chronic.  Controlled, 110/70.  Never smoker. - Given refill for enalapril-HCTZ 10-25 mg daily - Initiating Toprol-XL 100 mg daily for chronic chest pain and headache prophylaxis - Checking CBC, CMET, TSH  Mixed hyperlipidemia Chronic.  Patient not adherent to statin therapy due to expiration of medication.  On daily statin.  Never smoker. - Given refill atorvastatin 20 mg daily - Checking fasting lipid panel  GERD (gastroesophageal reflux disease) Chronic.  Controlled.  Has history of PPI use.  No red flags.  Could be contributing to complain of chronic chest pain. - Given prescription for omeprazole 20 mg daily  Tension headache, chronic Chronic.  Uncontrolled.  History of Fioricet and Excedrin use.  Only only taking Fioricet.  Does not seem migrainous.  No red flags. - Discontinuing Fioricet and Excedrin - Initiating Toprol-XL 100 mg daily with instructions to use Tylenol as needed    Pain in both forearms Chronic.  Uncertain etiology.  Does have some improvement with gabapentin use.  Patient not taking gabapentin 3 times daily as instructed. - Instructed to continue gabapentin 300 mg 3 times daily  Chronic chest pain Chronic.  Atypical.  Does not seem to be exacerbated  with exertion or alleviated with rest.  Suspicious for MSK etiology given trigger with gentle movement.  Does have an controlled GERD which may be contributing.  EKG NSR without ST changes or T wave abnormalities.  Patient well-appearing on exam.  Would likely benefit from cardiac stress test.  Well score 0 making PE unlikely.  Heart score 2 though unable to assess troponin level. - Ambulatory referral to cardiology - Initiating Toprol-XL 100 mg daily given history of chronic chest pain and headache  - RTC 1 month to reassess  Orders Placed This Encounter  Procedures  . CBC  . CMP14+EGFR  . TSH  . Lipid panel  . Ambulatory referral to Cardiology    Referral Priority:   Routine    Referral Type:   Consultation    Referral Reason:   Specialty Services Required    Requested Specialty:   Cardiology    Number of Visits Requested:   1  . EKG 12-Lead  . EKG 12-Lead    Ordered by WILLIAM HENSEL    Meds ordered this encounter  Medications  . omeprazole (PRILOSEC) 20 MG capsule    Sig: Take 1 capsule (20 mg total) by mouth daily.    Dispense:  90 capsule    Refill:  3  . enalapril-hydrochlorothiazide (VASERETIC) 10-25 MG tablet    Sig: Take 1 tablet by mouth daily.    Dispense:  90 tablet    Refill:  3  . metoprolol succinate (TOPROL-XL) 100 MG 24 hr tablet    Sig: Take 1 tablet (100 mg total) by mouth daily.    Dispense:  90 tablet    Refill:  3  . atorvastatin (LIPITOR) 20 MG tablet    Sig: Take 1 tablet (20 mg total) by mouth daily.    Dispense:  90 tablet    Refill:  3     , DO [facility], PGY-2 10/25/2017, 10:04 AM 

## 2017-10-25 NOTE — Assessment & Plan Note (Signed)
Chronic.  Patient not adherent to statin therapy due to expiration of medication.  On daily statin.  Never smoker. - Given refill atorvastatin 20 mg daily - Checking fasting lipid panel

## 2017-10-25 NOTE — Assessment & Plan Note (Addendum)
Chronic.  Uncontrolled.  History of Fioricet and Excedrin use.  Only only taking Fioricet.  Does not seem migrainous.  No red flags. - Discontinuing Fioricet and Excedrin - Initiating Toprol-XL 100 mg daily with instructions to use Tylenol as needed

## 2017-10-25 NOTE — Assessment & Plan Note (Signed)
Chronic.  Controlled.  Has history of PPI use.  No red flags.  Could be contributing to complain of chronic chest pain. - Given prescription for omeprazole 20 mg daily

## 2017-10-26 LAB — CMP14+EGFR
A/G RATIO: 1.6 (ref 1.2–2.2)
ALT: 17 IU/L (ref 0–32)
AST: 20 IU/L (ref 0–40)
Albumin: 4.2 g/dL (ref 3.5–5.5)
Alkaline Phosphatase: 79 IU/L (ref 39–117)
BUN/Creatinine Ratio: 10 (ref 9–23)
BUN: 9 mg/dL (ref 6–24)
CALCIUM: 10.1 mg/dL (ref 8.7–10.2)
CHLORIDE: 100 mmol/L (ref 96–106)
CO2: 30 mmol/L — ABNORMAL HIGH (ref 20–29)
Creatinine, Ser: 0.9 mg/dL (ref 0.57–1.00)
GFR calc Af Amer: 81 mL/min/{1.73_m2} (ref >59)
GFR, EST NON AFRICAN AMERICAN: 70 mL/min/{1.73_m2} (ref >59)
Globulin, Total: 2.7 g/dL (ref 1.5–4.5)
Glucose: 98 mg/dL (ref 65–99)
POTASSIUM: 4.5 mmol/L (ref 3.5–5.2)
Sodium: 141 mmol/L (ref 134–144)
Total Protein: 6.9 g/dL (ref 6.0–8.5)

## 2017-10-26 LAB — CBC
Hematocrit: 40.6 % (ref 34.0–46.6)
Hemoglobin: 13.5 g/dL (ref 11.1–15.9)
MCH: 30.1 pg (ref 26.6–33.0)
MCHC: 33.3 g/dL (ref 31.5–35.7)
MCV: 91 fL (ref 79–97)
PLATELETS: 252 10*3/uL (ref 150–450)
RBC: 4.48 x10E6/uL (ref 3.77–5.28)
RDW: 13.9 % (ref 12.3–15.4)
WBC: 6.7 10*3/uL (ref 3.4–10.8)

## 2017-10-26 LAB — LIPID PANEL
CHOLESTEROL TOTAL: 221 mg/dL — AB (ref 100–199)
Chol/HDL Ratio: 6 ratio — ABNORMAL HIGH (ref 0.0–4.4)
HDL: 37 mg/dL — ABNORMAL LOW (ref >39)
LDL Calculated: 136 mg/dL — ABNORMAL HIGH (ref 0–99)
Triglycerides: 242 mg/dL — ABNORMAL HIGH (ref 0–149)
VLDL Cholesterol Cal: 48 mg/dL — ABNORMAL HIGH (ref 5–40)

## 2017-10-26 LAB — TSH: TSH: 1.82 u[IU]/mL (ref 0.450–4.500)

## 2017-11-22 ENCOUNTER — Other Ambulatory Visit: Payer: Self-pay

## 2017-11-22 ENCOUNTER — Encounter: Payer: Self-pay | Admitting: Family Medicine

## 2017-11-22 ENCOUNTER — Ambulatory Visit: Payer: BLUE CROSS/BLUE SHIELD | Admitting: Family Medicine

## 2017-11-22 VITALS — BP 110/72 | HR 89 | Temp 98.3°F | Wt 162.8 lb

## 2017-11-22 DIAGNOSIS — E782 Mixed hyperlipidemia: Secondary | ICD-10-CM | POA: Diagnosis not present

## 2017-11-22 DIAGNOSIS — G8929 Other chronic pain: Secondary | ICD-10-CM

## 2017-11-22 DIAGNOSIS — R079 Chest pain, unspecified: Secondary | ICD-10-CM

## 2017-11-22 DIAGNOSIS — T148XXA Other injury of unspecified body region, initial encounter: Secondary | ICD-10-CM | POA: Diagnosis not present

## 2017-11-22 NOTE — Patient Instructions (Addendum)
Gracias por venir a vernos hoy. Consulte a continuacin para revisar nuestro plan para la visita de hoy.  Contine tomando la atorvastatina (Lipitor) y el metoprolol (Toprol-XL) segn lo prescrito.  Te volveremos a ver el 5 de agosto a las 8:50 AM.  Sera mejor que trajeras a alguien que viva contigo o a un pariente familiar para hablar de tu prdida de memoria.  Vamos a comprobar algunos anlisis de [employer] Life [employer]angre durante este tiempo, as que por favor asegrese de que no tiene nada que comer o beber (excepto para el caf negro) esa maana.  Llame a la clnica al [phone] si sus sntomas empeoran o si tiene alguna inquietud. Fue Regulatory affairs officerun placer servirle.  Durward Parcelavid McMullen, DO [facility] [place], PGY-3    Thank you for coming in to see us today. Please see below to review our plan for today's visit.  Continue taking the atorvastatin (Lipitor) and metoprolol (Toprol-XL) as prescribed.  We will see you again on August 5 at 8:50 AM.  It would be best if you brought somebody that lives with you or a family relative to discuss your memory loss.  We will check some blood work during this time so please make sure you have nothing to eat or drink (except for black coffee) that morning.  Please call the clinic at [phone] if your symptoms worsen or you have any concerns. It was our pleasure to serve you.  Durward Parcelavid McMullen, DO [facility], PGY-3

## 2017-11-22 NOTE — Assessment & Plan Note (Signed)
Chronic.  Improved.  Patient reports no pain since initiating Toprol-XL. - Continue Toprol-XL 100 mg daily - Reviewed return precautions

## 2017-11-22 NOTE — Progress Notes (Signed)
   Subjective   Patient ID: Nancy Gilbert    DOB: 09/07/1960, 57 y.o. female   MRN: [id number]  CC: "Follow-up"  HPI: Nancy Gilbert is a 57 y.o. female who presents to clinic today for the following:  Atypical chest pain: Patient here today for follow-up regarding chronic atypical chest pain.  Patient was seen in office on 10/25/2017 for left-sided remittent chest pain which she reported having over the past several years.  EKG revealed NSR without ST changes or T wave abnormalities.  She has no history of heart disease and is a non-smoker.  She reported pain occurring during rest at times.  She has been adherent to her Toprol-XL and denies having any chest pain, shortness of breath, palpitations, fatigue or syncope since her last visit.  Hyperlipidemia: Tolerating atorvastatin since last visit approximately 1 month ago.  Prior to this, patient stated she was not taking her cholesterol medication but was adherent to her daily aspirin.  Bruising: Patient endorsing multiple bruises particularly on thighs.  She reports having similar areas of bruising in the past.  She is previously taking aspirin.  She denies family history of bleeding disorders.  Did have a normal CBC during her visit 1 month ago.  Patient denies history of trauma or abuse.  ROS: see HPI for pertinent.  PMFSH: HTN, HLD, R-rotator cuff tear.Surgical history tubal. Family history heart disease, CKD, HTN, HLD, depression.Surgical history tubal. Family history heart disease, DM, CKD, HTN, HLD.Smoking status reviewed. Medications reviewed.  Objective   BP 110/72   Pulse 89   Temp 98.3 F (36.8 C) (Oral)   Wt 162 lb 12.8 oz (73.8 kg)   SpO2 99%   BMI 27.09 kg/m  Vitals and nursing note reviewed.  General: well nourished, well developed, NAD with non-toxic appearance HEENT: normocephalic, atraumatic, moist mucous membranes Cardiovascular: regular rate and rhythm without murmurs, rubs, or  gallops Lungs: clear to auscultation bilaterally with normal work of breathing Abdomen: soft, non-tender, non-distended, normoactive bowel sounds Skin: warm, dry, no rashes or lesions, cap refill < 2 seconds, mild ecchymoses on right thigh only Extremities: warm and well perfused, normal tone, no edema  Assessment & Plan   Bruising Acute on chronic.  Mild ecchymoses only on thighs.  No history of trauma or abuse.  Patient is taking daily aspirin. - Monitor and consider further work-up if persistent in the next 2 weeks - Reviewed return precautions  - Consider discontinuing aspirin at next visit  Chronic chest pain Chronic.  Improved.  Patient reports no pain since initiating Toprol-XL. - Continue Toprol-XL 100 mg daily - Reviewed return precautions  Mixed hyperlipidemia Chronic.  Long history of mixed hyperlipidemia though not adherent to atorvastatin in the past.  Seems to be adherent now.  Will need to recheck in approximately 2 to 4 weeks from today. - Continue atorvastatin 20 mg daily - Recheck fasting lipid panel in 2 weeks  No orders of the defined types were placed in this encounter.  No orders of the defined types were placed in this encounter.   Durward Parcelavid McMullen, DO [facility], PGY-3 11/22/2017, 2:19 PM

## 2017-11-22 NOTE — Assessment & Plan Note (Addendum)
Acute on chronic.  Mild ecchymoses only on thighs.  No history of trauma or abuse.  Patient is taking daily aspirin. - Monitor and consider further work-up if persistent in the next 2 weeks - Reviewed return precautions  - Consider discontinuing aspirin at next visit

## 2017-11-22 NOTE — Assessment & Plan Note (Signed)
Chronic.  Long history of mixed hyperlipidemia though not adherent to atorvastatin in the past.  Seems to be adherent now.  Will need to recheck in approximately 2 to 4 weeks from today. - Continue atorvastatin 20 mg daily - Recheck fasting lipid panel in 2 weeks

## 2017-12-16 ENCOUNTER — Other Ambulatory Visit: Payer: Self-pay

## 2017-12-16 ENCOUNTER — Encounter: Payer: Self-pay | Admitting: Family Medicine

## 2017-12-16 ENCOUNTER — Ambulatory Visit: Payer: BLUE CROSS/BLUE SHIELD | Admitting: Family Medicine

## 2017-12-16 VITALS — BP 119/80 | HR 64 | Temp 98.7°F | Ht 65.0 in | Wt 164.0 lb

## 2017-12-16 DIAGNOSIS — F0390 Unspecified dementia without behavioral disturbance: Secondary | ICD-10-CM | POA: Insufficient documentation

## 2017-12-16 DIAGNOSIS — E782 Mixed hyperlipidemia: Secondary | ICD-10-CM

## 2017-12-16 DIAGNOSIS — R4189 Other symptoms and signs involving cognitive functions and awareness: Secondary | ICD-10-CM

## 2017-12-16 DIAGNOSIS — G300 Alzheimer's disease with early onset: Secondary | ICD-10-CM

## 2017-12-16 DIAGNOSIS — T148XXA Other injury of unspecified body region, initial encounter: Secondary | ICD-10-CM

## 2017-12-16 DIAGNOSIS — F0280 Dementia in other diseases classified elsewhere without behavioral disturbance: Secondary | ICD-10-CM

## 2017-12-16 DIAGNOSIS — R413 Other amnesia: Secondary | ICD-10-CM

## 2017-12-16 HISTORY — DX: Dementia in other diseases classified elsewhere, unspecified severity, without behavioral disturbance, psychotic disturbance, mood disturbance, and anxiety: F02.80

## 2017-12-16 HISTORY — DX: Alzheimer's disease with early onset: G30.0

## 2017-12-16 HISTORY — DX: Other symptoms and signs involving cognitive functions and awareness: R41.89

## 2017-12-16 NOTE — Progress Notes (Signed)
Subjective   Patient ID: Nancy Gilbert    DOB: June 22, 1960, 57 y.o. female   MRN: [id number]  CC: "memory loss"  HPI: Nancy Gilbert is a 57 y.o. female who presents to clinic today for the following:  Memory loss: Patient presenting today accompanied by her husband regarding memory loss.  Onset 3 years with gradual worsening.  Patient reports primary difficulty with losing short-term memory particularly with money, taking medications, and forgetting where she is driving.  Patient states this occurs daily.  Patient has a 838 year old sister who has "trouble with her mind."  She is unsure when this began.  Patient denies history of hearing or vision loss, unstable gait, dysarthria.  She did report a mechanical fall the other day while in the grass in her front yard but otherwise denies falls.  Bruising: Patient continues to have bruising primarily on extremities including left upper thigh, right wrist.  These appear to be affecting her face or back.  She does take aspirin daily.  She denies family history of blood disorders.  Hyperlipidemia: Patient continues to take high intensity statin along with daily aspirin.  She has a history of nonadherence to this medication reports taking it as instructed.  ROS: see HPI for pertinent.  PMFSH: HTN, HLD, R-rotator cuff tear.Surgical history tubal. Family history heart disease, CKD, HTN, HLD, depression.Surgical history tubal. Family history heart disease, DM, CKD, HTN, HLD.Smoking status reviewed. Medications reviewed.  Objective   BP 119/80   Pulse 64   Temp 98.7 F (37.1 C) (Oral)   Ht 5\' 5"  (1.651 m)   Wt 164 lb (74.4 kg)   SpO2 98%   BMI 27.29 kg/m  Vitals and nursing note reviewed.  General: well nourished, well developed, NAD with non-toxic appearance HEENT: normocephalic, atraumatic, moist mucous membranes, PERRLA, EOMI Cardiovascular: regular rate and rhythm without murmurs, rubs, or gallops Lungs: clear to  auscultation bilaterally with normal work of breathing Skin: warm, dry, cap refill < 2 seconds, multiple small ecchymoses on extremities particularly on right wrist and upper left thigh Extremities: warm and well perfused, normal tone, no edema Neuro: CN II-XII intact, no dysarthria, stable gait Psych: dysthymic mood, flat affect  Assessment & Plan   Mixed hyperlipidemia Chronic.  On high intensity statin and daily aspirin. - Checking LDL to assure response to Lipitor 40 mg daily  Memory loss of unknown cause Chronic.  Uncertain etiology.  Seems to be affecting primarily short-term memory.  Unable to ascertain if depression is playing a role as patient began to become very tearful during exam.  Patient did receive a MOCA score of 12/30.  There may be a family history of memory disturbance.  I discussed with patient and husband regarding further work-up including lab work.  Patient would like to proceed with further work-up regarding her memory loss. - Checking HIV antibody, RPR, vitamin D 25-hydroxy, vitamin B12, TSH - If normal, would consider brain MRI as the next step - RTC 2 weeks  Bruising Chronic.  Uncertain etiology.  Patient on daily aspirin.  Not appear to be NAD. - Follow-up at next appointment - Advised patient to avoid aspirin and other blood thinners in the interim  Orders Placed This Encounter  Procedures  . RPR  . HIV antibody (with reflex)  . VITAMIN D 25 Hydroxy (Vit-D Deficiency, Fractures)  . Vitamin B12  . TSH  . LDL cholesterol, direct   No orders of the defined types were placed in this encounter.  Durward Parcel, DO [facility], PGY-3 12/17/2017, 9:07 AM

## 2017-12-16 NOTE — Patient Instructions (Signed)
Thank you for coming in to see us today. Please see below to review our plan for today's visit.  We will discuss the results of your labs at your next visit in approximately 2-4 weeks.  Please call the clinic at [phone] if your symptoms worsen or you have any concerns. It was our pleasure to serve you.  Durward Parcelavid Lesleigh Hughson, DO [facility], PGY-3

## 2017-12-17 LAB — VITAMIN B12: Vitamin B-12: 406 pg/mL (ref 232–1245)

## 2017-12-17 LAB — RPR: RPR: NONREACTIVE

## 2017-12-17 LAB — TSH: TSH: 1.56 u[IU]/mL (ref 0.450–4.500)

## 2017-12-17 LAB — HIV ANTIBODY (ROUTINE TESTING W REFLEX): HIV Screen 4th Generation wRfx: NONREACTIVE

## 2017-12-17 LAB — LDL CHOLESTEROL, DIRECT: LDL Direct: 163 mg/dL — ABNORMAL HIGH (ref 0–99)

## 2017-12-17 LAB — VITAMIN D 25 HYDROXY (VIT D DEFICIENCY, FRACTURES): Vit D, 25-Hydroxy: 32.9 ng/mL (ref 30.0–100.0)

## 2017-12-17 NOTE — Assessment & Plan Note (Signed)
Chronic.  Uncertain etiology.  Patient on daily aspirin.  Not appear to be NAD. - Follow-up at next appointment - Advised patient to avoid aspirin and other blood thinners in the interim

## 2017-12-17 NOTE — Assessment & Plan Note (Signed)
Chronic.  On high intensity statin and daily aspirin. - Checking LDL to assure response to Lipitor 40 mg daily

## 2017-12-17 NOTE — Assessment & Plan Note (Signed)
Chronic.  Uncertain etiology.  Seems to be affecting primarily short-term memory.  Unable to ascertain if depression is playing a role as patient began to become very tearful during exam.  Patient did receive a MOCA score of 12/30.  There may be a family history of memory disturbance.  I discussed with patient and husband regarding further work-up including lab work.  Patient would like to proceed with further work-up regarding her memory loss. - Checking HIV antibody, RPR, vitamin D 25-hydroxy, vitamin B12, TSH - If normal, would consider brain MRI as the next step - RTC 2 weeks

## 2017-12-25 ENCOUNTER — Telehealth: Payer: Self-pay | Admitting: Family Medicine

## 2017-12-30 ENCOUNTER — Other Ambulatory Visit: Payer: Self-pay

## 2017-12-30 ENCOUNTER — Encounter: Payer: Self-pay | Admitting: Family Medicine

## 2017-12-30 ENCOUNTER — Ambulatory Visit: Payer: BLUE CROSS/BLUE SHIELD | Admitting: Family Medicine

## 2017-12-30 VITALS — BP 114/62 | HR 68 | Temp 98.2°F | Ht 65.0 in | Wt 163.6 lb

## 2017-12-30 DIAGNOSIS — R413 Other amnesia: Secondary | ICD-10-CM | POA: Diagnosis not present

## 2017-12-30 NOTE — Assessment & Plan Note (Addendum)
Given all labs were normal ordering brain MRI for patient to complete work up started by her PCP, Dr. Abelardo DieselMcMullen. Informed patient to follow up after the MRI.  Obtaining CMP today to ensure renal function is within limits for contrast MRI.

## 2017-12-30 NOTE — Progress Notes (Signed)
     Subjective: Chief Complaint  Patient presents with  . Follow-up   HPI: Nancy Gilbert is a 57 y.o. presenting to clinic today to discuss the following:  Memory Loss Follow Up Patient reports no change in her memory loss symptoms. She denies any confusion, difficulty with speech or swallowing, no weakness or numbness or inability to control her extremities, no difficulty walking. I discussed the results of her labs with her from the last appointment and that they were all normal. She asked if it was ok to take a multivitamin and I informed her it would be fine to take on OTC daily but it may not help with her memory issues.  Health Maintenance: none today     ROS noted in HPI.   Past Medical, Surgical, Social, and Family History Reviewed & Updated per EMR.   Pertinent Historical Findings include:   Social History   Tobacco Use  Smoking Status Never Smoker  Smokeless Tobacco Never Used    Objective: BP 114/62   Pulse 68   Temp 98.2 F (36.8 C) (Oral)   Ht 5\' 5"  (1.651 m)   Wt 163 lb 9.6 oz (74.2 kg)   SpO2 99%   BMI 27.22 kg/m  Vitals and nursing notes reviewed  Physical Exam Gen: Alert and Oriented x 3, NAD HEENT: Normocephalic, atraumatic, PERRLA, EOMI CV: RRR, no murmurs, normal S1, S2 split Resp: CTAB, no wheezing, rales, or rhonchi, comfortable work of breathing Abd: non-distended, non-tender, soft, +bs in all four quadrants MSK: Moves all four extremities Ext: no clubbing, cyanosis, or edema Neuro: No gross deficits Skin: warm, dry, intact, no rashes  No results found for this or any previous visit (from the past 72 hour(s)).  Assessment/Plan:  Memory loss of unknown cause Given all labs were normal ordering brain MRI for patient to complete work up started by her PCP, Dr. Abelardo DieselMcMullen. Informed patient to follow up after the MRI.  Obtaining CMP today to ensure renal function is within limits for contrast MRI.   PATIENT EDUCATION  PROVIDED: See AVS    Diagnosis and plan along with any newly prescribed medication(s) were discussed in detail with this patient today. The patient verbalized understanding and agreed with the plan. Patient advised if symptoms worsen return to clinic or ER.   Health Maintainance:   Orders Placed This Encounter  Procedures  . MR Brain W Wo Contrast    Standing Status:   Future    Standing Expiration Date:   12/31/2018    Order Specific Question:   If indicated for the ordered procedure, I authorize the administration of contrast media per Radiology protocol    Answer:   Yes    Order Specific Question:   What is the patient's sedation requirement?    Answer:   Anti-anxiety    Order Specific Question:   Does the patient have a pacemaker or implanted devices?    Answer:   No    Order Specific Question:   Radiology Contrast Protocol - do NOT remove file path    Answer:   \\[email or web address]    Order Specific Question:   Preferred imaging location?    Answer[facility] (table limit-500 lbs)  . Comprehensive metabolic panel    No orders of the defined types were placed in this encounter.    Jules Schickim Masahiro Iglesia, DO 12/30/2017, 10:42 AM PGY-2 [facility]

## 2017-12-30 NOTE — Patient Instructions (Signed)
  Fue un placer conocerte hoy!  He pedido una resonancia magntica cerebral para ti hoy. Nuestra oficina programar esto para usted y Chief Executive Officerle notificaremos cundo venir a Loss adjuster, charteredrealizar la exploracin.  Por favor, siga con nosotros en la clnica despus de que tenga la resonancia magntica.  Jules Schickim Veida Spira, DO [facility], PGY-2

## 2017-12-31 LAB — COMPREHENSIVE METABOLIC PANEL
ALK PHOS: 76 IU/L (ref 39–117)
ALT: 27 IU/L (ref 0–32)
AST: 24 IU/L (ref 0–40)
Albumin/Globulin Ratio: 1.6 (ref 1.2–2.2)
Albumin: 4.2 g/dL (ref 3.5–5.5)
BILIRUBIN TOTAL: 0.3 mg/dL (ref 0.0–1.2)
BUN/Creatinine Ratio: 19 (ref 9–23)
BUN: 18 mg/dL (ref 6–24)
CHLORIDE: 100 mmol/L (ref 96–106)
CO2: 27 mmol/L (ref 20–29)
Calcium: 10.2 mg/dL (ref 8.7–10.2)
Creatinine, Ser: 0.94 mg/dL (ref 0.57–1.00)
GFR calc Af Amer: 77 mL/min/{1.73_m2} (ref >59)
GFR calc non Af Amer: 67 mL/min/{1.73_m2} (ref >59)
Globulin, Total: 2.7 g/dL (ref 1.5–4.5)
Glucose: 97 mg/dL (ref 65–99)
Potassium: 3.8 mmol/L (ref 3.5–5.2)
Sodium: 141 mmol/L (ref 134–144)
Total Protein: 6.9 g/dL (ref 6.0–8.5)

## 2018-01-03 ENCOUNTER — Ambulatory Visit (HOSPITAL_COMMUNITY): Admission: RE | Admit: 2018-01-03 | Payer: BLUE CROSS/BLUE SHIELD | Source: Ambulatory Visit

## 2018-01-20 ENCOUNTER — Encounter: Payer: Self-pay | Admitting: Family Medicine

## 2018-01-20 ENCOUNTER — Ambulatory Visit: Payer: BLUE CROSS/BLUE SHIELD | Admitting: Family Medicine

## 2018-01-20 VITALS — BP 106/70 | HR 95 | Temp 98.0°F | Ht 65.0 in | Wt 165.2 lb

## 2018-01-20 DIAGNOSIS — H538 Other visual disturbances: Secondary | ICD-10-CM | POA: Diagnosis not present

## 2018-01-20 DIAGNOSIS — R413 Other amnesia: Secondary | ICD-10-CM | POA: Diagnosis not present

## 2018-01-20 DIAGNOSIS — F3289 Other specified depressive episodes: Secondary | ICD-10-CM

## 2018-01-20 DIAGNOSIS — F329 Major depressive disorder, single episode, unspecified: Secondary | ICD-10-CM | POA: Insufficient documentation

## 2018-01-20 DIAGNOSIS — F39 Unspecified mood [affective] disorder: Secondary | ICD-10-CM

## 2018-01-20 DIAGNOSIS — I10 Essential (primary) hypertension: Secondary | ICD-10-CM | POA: Diagnosis not present

## 2018-01-20 MED ORDER — ENALAPRIL-HYDROCHLOROTHIAZIDE 10-25 MG PO TABS: 1.0000 | ORAL_TABLET | Freq: Every day | ORAL | 3 refills | Status: DC

## 2018-01-20 NOTE — Patient Instructions (Signed)
Gracias por venir a vernos hoy. Consulte a continuacin para revisar nuestro plan para la visita de hoy.  1. No estoy seguro de por qu tiene prdida de memoria.  Su anlisis de sangre pareca ser normal durante nuestra ltima visita.  Necesitamos que te hagan una Health visitor.  Te hemos reprogramado para esto el sbado 14 de septiembre a las 10:30 AM.  Por favor, no se pierda su prxima cita.  Es muy importante que hagamos esta imagen. 2. Le hemos programado ver al Dr. McDiarmid el 17 de octubre a las 2:30 PM.  Por favor, asegrese de que est acompaado por su esposo durante esta cita y traiga todos sus medicamentos. 3. Si tiene algn cambio en su visin, especialmente si tiene visin doble o prdida de visin, o debilidad repentina o dificultad para caminar, busque atencin mdica inmediata.  Llame a la clnica al [phone] si sus sntomas empeoran o si tiene alguna inquietud. Fue Regulatory affairs officer.  Durward Parcel, DO [facility], PGY-3    Thank you for coming in to see [place] today. Please see below to review our plan for today's visit.  1.  I am not certain why you have memory loss.  Your blood work appeared to be normal during our last visit.  We do need you to have an MRI done.  We have rescheduled you for this on Saturday September 14th at 10:30 AM.  Please do not miss his next appointment.  It is very important that we get this image done. 2.  We have scheduled you to to see Dr. Perley Jain on October 17 at 2:30 PM.  Please make sure you are accompanied by her husband during this appointment and bring all of your medications. 3.  If you have any changes to your vision, especially if you have double vision or loss of vision, or sudden weakness or difficulty walking, seek immediate medical attention.  Please call the clinic at [phone] if your symptoms worsen or you have any concerns. It was our pleasure to serve you.  Durward Parcel, DO [facility], PGY-3

## 2018-01-20 NOTE — Progress Notes (Signed)
Subjective   Patient ID: Nancy Gilbert    DOB: 04-30-1961, 57 y.o. female   MRN: [id number]  CC: "Memory loss"  HPI: Nancy Gilbert de Gretta Arab is a 57 y.o. female who presents to clinic today for the following:  Memory loss: Patient here today for follow-up regarding chronic progressive memory loss over the past several years, more notably over the last several months.  She is accompanied by her husband who has provided the majority of the information during this interview.  Patient did score 12/30 on Moca exam during her last visit.  Extensive lab work-up including CMET, B12, vitamin D, TSH, HIV, and RPR are on remarkable.  MRI brain was ordered however patient failed to make appointment.  Patient's husband reports continuation of forgetfulness with short-term memory.  He denies her having any recent history of focal weakness, change in speech, falls, fatigue, urinary incontinence.  Blurred vision: Patient reports having some blurred vision in her eyes bilaterally.  She feels that this is been going on for the last 2 weeks.  She does have a history of bilateral blurred vision for over 1 year.  She reports good vision with use of her current prescription glasses.  She denies history of double vision, complete loss of vision, syncope, or eye pain.  Depression: Patient does endorse feelings of depression.  Patient unable to specify if this was present prior to her progressive memory loss.  She has no prior history of depression and has not been treated with medications were seen by therapy in the past.  Hypertension: Patient with long-standing attention.  She reports good adherence to her medication despite her short-term memory loss.  She is a non-smoker.  ROS: see HPI for pertinent.  PMFSH: HTN, HLD, R-rotator cuff tear.Surgical history tubal. Family history heart disease, CKD, HTN, HLD, depression.Surgical history tubal. Family history heart disease, DM, CKD, HTN, HLD. Smoking  status reviewed. Medications reviewed.  Objective   BP 106/70   Pulse 95   Temp 98 F (36.7 C) (Oral)   Ht 5\' 5"  (1.651 m)   Wt 165 lb 3.2 oz (74.9 kg)   SpO2 99%   BMI 27.49 kg/m  Vitals and nursing note reviewed.  General: well nourished, well developed, NAD with non-toxic appearance HEENT: normocephalic, atraumatic, moist mucous membranes, PERRLA, EOMI, no nystagmus Cardiovascular: regular rate and rhythm without murmurs, rubs, or gallops Lungs: clear to auscultation bilaterally with normal work of breathing Skin: warm, dry, no rashes or lesions, cap refill < 2 seconds Extremities: warm and well perfused, normal tone, no edema, 5/5 motor strength in all 4 extremities Neuro: CN II-XII intact, motor intact, no dysphasia, no facial droop or ptosis, patient able to ambulate without assistance easily onto exam table  Assessment & Plan   Primary hypertension Chronic.  Well-controlled.  Non-smoker. - Given refill for enalapril-HCTZ 10-25 mg daily  Memory loss of unknown cause Chronic.  Uncertain etiology.  Moca score 12/30.  Differential includes Alzheimer's, vascular dementia.  NPH less likely given lack of urinary incontinence and stable gait.  There does appear to be a more rapid progression over the last year per patient and husband's report.  Lewy body and frontotemporal dementia are a possibility though less likely.  Will need to visualize brain anatomy to help ascertain. - Instructed patient to follow-up for rescheduled MRI of brain in the [facility] ED on 01/24/2018 at 10:30 AM - Given complexity of situation, patient scheduled for geriatric clinic for dementia evaluation on 02/27/2018 at  2:30 PM  Blurred vision, bilateral Chronic.  Continues to be problematic intermittently.  Patient appears to have no difficulty with vision.  Red flags.  Overdue for annual eye exam. - Advised patient to contact local eye care center for examination  Depressive state Uncertain chronicity.  Not  sure if this is truly depression though patient does report sadness.  She is unable to answer PHQ 9 despite multiple efforts to help guide on numerical value based on positive symptoms.  This may be exacerbated by her memory loss, but may also be prior to her memory loss. - See plan for dementia evaluation, could consider SSRI in the future  No orders of the defined types were placed in this encounter.  Meds ordered this encounter  Medications  . enalapril-hydrochlorothiazide (VASERETIC) 10-25 MG tablet    Sig: Take 1 tablet by mouth daily.    Dispense:  90 tablet    Refill:  3    Durward Parcel, DO [facility], PGY-3 01/20/2018, 5:41 PM

## 2018-01-20 NOTE — Assessment & Plan Note (Signed)
Uncertain chronicity.  Not sure if this is truly depression though patient does report sadness.  She is unable to answer PHQ 9 despite multiple efforts to help guide on numerical value based on positive symptoms.  This may be exacerbated by her memory loss, but may also be prior to her memory loss. - See plan for dementia evaluation, could consider SSRI in the future

## 2018-01-20 NOTE — Assessment & Plan Note (Signed)
Chronic.  Uncertain etiology.  Moca score 12/30.  Differential includes Alzheimer's, vascular dementia.  NPH less likely given lack of urinary incontinence and stable gait.  There does appear to be a more rapid progression over the last year per patient and husband's report.  Lewy body and frontotemporal dementia are a possibility though less likely.  Will need to visualize brain anatomy to help ascertain. - Instructed patient to follow-up for rescheduled MRI of brain in the [facility] ED on 01/24/2018 at 10:30 AM - Given complexity of situation, patient scheduled for geriatric clinic for dementia evaluation on 02/27/2018 at 2:30 PM

## 2018-01-20 NOTE — Assessment & Plan Note (Signed)
Chronic.  Continues to be problematic intermittently.  Patient appears to have no difficulty with vision.  Red flags.  Overdue for annual eye exam. - Advised patient to contact local eye care center for examination

## 2018-01-20 NOTE — Assessment & Plan Note (Signed)
Chronic.  Well-controlled.  Non-smoker. - Given refill for enalapril-HCTZ 10-25 mg daily

## 2018-01-25 ENCOUNTER — Ambulatory Visit (HOSPITAL_COMMUNITY)
Admission: RE | Admit: 2018-01-25 | Discharge: 2018-01-25 | Disposition: A | Discharge status: Home / Self Care | Payer: BLUE CROSS/BLUE SHIELD | Source: Ambulatory Visit | Attending: Family Medicine | Admitting: Family Medicine

## 2018-01-25 DIAGNOSIS — R413 Other amnesia: Secondary | ICD-10-CM | POA: Diagnosis present

## 2018-01-25 MED ORDER — GADOBUTROL 1 MMOL/ML IV SOLN
7.0000 mL | Freq: Once | INTRAVENOUS | Status: AC | PRN
  Administered 2018-01-25: 7 mL via INTRAVENOUS

## 2018-01-27 ENCOUNTER — Ambulatory Visit (INDEPENDENT_AMBULATORY_CARE_PROVIDER_SITE_OTHER): Payer: BLUE CROSS/BLUE SHIELD | Admitting: *Deleted

## 2018-01-27 DIAGNOSIS — Z23 Encounter for immunization: Secondary | ICD-10-CM

## 2018-01-28 ENCOUNTER — Encounter: Payer: Self-pay | Admitting: Family Medicine

## 2018-02-05 ENCOUNTER — Other Ambulatory Visit: Payer: Self-pay

## 2018-02-05 ENCOUNTER — Encounter (HOSPITAL_COMMUNITY): Payer: Self-pay | Admitting: Emergency Medicine

## 2018-02-05 ENCOUNTER — Encounter: Payer: Self-pay | Admitting: Family Medicine

## 2018-02-05 ENCOUNTER — Emergency Department (HOSPITAL_COMMUNITY): Payer: BLUE CROSS/BLUE SHIELD

## 2018-02-05 ENCOUNTER — Emergency Department (HOSPITAL_COMMUNITY)
Admission: EM | Admit: 2018-02-05 | Discharge: 2018-02-05 | Disposition: A | Discharge status: Home / Self Care | Payer: BLUE CROSS/BLUE SHIELD | Attending: Emergency Medicine | Admitting: Emergency Medicine

## 2018-02-05 DIAGNOSIS — Z79899 Other long term (current) drug therapy: Secondary | ICD-10-CM | POA: Insufficient documentation

## 2018-02-05 DIAGNOSIS — R0602 Shortness of breath: Secondary | ICD-10-CM

## 2018-02-05 DIAGNOSIS — I10 Essential (primary) hypertension: Secondary | ICD-10-CM | POA: Diagnosis not present

## 2018-02-05 HISTORY — DX: Essential (primary) hypertension: I10

## 2018-02-05 LAB — COMPREHENSIVE METABOLIC PANEL
ALBUMIN: 3.9 g/dL (ref 3.5–5.0)
ALK PHOS: 66 U/L (ref 38–126)
ALT: 25 U/L (ref 0–44)
ANION GAP: 14 (ref 5–15)
AST: 30 U/L (ref 15–41)
BILIRUBIN TOTAL: 0.6 mg/dL (ref 0.3–1.2)
BUN: 13 mg/dL (ref 6–20)
CALCIUM: 10.2 mg/dL (ref 8.9–10.3)
CO2: 21 mmol/L — ABNORMAL LOW (ref 22–32)
Chloride: 104 mmol/L (ref 98–111)
Creatinine, Ser: 1.01 mg/dL — ABNORMAL HIGH (ref 0.44–1.00)
GFR, EST NON AFRICAN AMERICAN: 60 mL/min — AB (ref >60)
GLUCOSE: 118 mg/dL — AB (ref 70–99)
Potassium: 3.5 mmol/L (ref 3.5–5.1)
Sodium: 139 mmol/L (ref 135–145)
TOTAL PROTEIN: 7.3 g/dL (ref 6.5–8.1)

## 2018-02-05 LAB — CBC
HCT: 40 % (ref 36.0–46.0)
HEMOGLOBIN: 13.5 g/dL (ref 12.0–15.0)
MCH: 30.3 pg (ref 26.0–34.0)
MCHC: 33.8 g/dL (ref 30.0–36.0)
MCV: 89.7 fL (ref 78.0–100.0)
Platelets: 261 10*3/uL (ref 150–400)
RBC: 4.46 MIL/uL (ref 3.87–5.11)
RDW: 12.3 % (ref 11.5–15.5)
WBC: 9.1 10*3/uL (ref 4.0–10.5)

## 2018-02-05 LAB — URINALYSIS, ROUTINE W REFLEX MICROSCOPIC
BACTERIA UA: NONE SEEN
BILIRUBIN URINE: NEGATIVE
GLUCOSE, UA: NEGATIVE mg/dL
Hgb urine dipstick: NEGATIVE
KETONES UR: NEGATIVE mg/dL
NITRITE: NEGATIVE
PH: 9 — AB (ref 5.0–8.0)
Protein, ur: NEGATIVE mg/dL
Specific Gravity, Urine: 1.004 — ABNORMAL LOW (ref 1.005–1.030)

## 2018-02-05 LAB — LIPASE, BLOOD: Lipase: 48 U/L (ref 11–51)

## 2018-02-05 LAB — I-STAT TROPONIN, ED: Troponin i, poc: 0 ng/mL (ref 0.00–0.08)

## 2018-02-05 MED ORDER — GI COCKTAIL ~~LOC~~
30.0000 mL | Freq: Once | ORAL | Status: AC
  Administered 2018-02-05: 30 mL via ORAL
  Filled 2018-02-05: qty 30

## 2018-02-05 NOTE — ED Notes (Signed)
Patient Alert and oriented to baseline. Stable and ambulatory to baseline. Patient verbalized understanding of the discharge instructions.  Patient belongings were taken by the patient.   

## 2018-02-05 NOTE — ED Notes (Signed)
Per radiology transporter, patient had near syncopal episode in xray

## 2018-02-05 NOTE — ED Triage Notes (Addendum)
Pt reports sob and headache that started 9pm last night. Respirations labored, lungs clear in triage. Pt also reports middle abd pain that gets worse when she breathes regularly. (?) Denies N/V/D. Pt speaks Spanish. Interpreter used.

## 2018-02-05 NOTE — ED Provider Notes (Signed)
[facility] EMERGENCY DEPARTMENT Provider Note  CSN: [id number] Arrival date & time: 02/05/18 0254  Chief Complaint(s) Shortness of Breath; Abdominal Pain; and Headache  HPI Nancy Gilbert is a 57 y.o. female   The history is provided by the patient.  Shortness of Breath  This is a recurrent (for several months) problem. The average episode lasts 2 hours. The current episode started 3 to 5 hours ago. The problem has been resolved. Pertinent negatives include no rhinorrhea, no cough, no sputum production, no hemoptysis, no chest pain, no leg swelling and no claudication. Associated medical issues do not include COPD, pneumonia, chronic lung disease, CAD, heart failure, past MI or DVT.   Reports that her dyspnea feels like she has a hard time taking in a deep breath.  She reports when she has a sensation she feels like she has to breathe fast.  She states that she has had bilateral hand tingling during these episodes.  Past Medical History Past Medical History:  Diagnosis Date  . Hypertension    There are no active problems to display for this patient.  Home Medication(s) Prior to Admission medications   Medication Sig Start Date End Date Taking? Authorizing Provider  ENALAPRIL MALEATE PO Take 1 tablet by mouth daily.   Yes [provider]  PRESCRIPTION MEDICATION Take 1 tablet by mouth daily. Cholesterol medication   Yes [provider]                                                                                                                                    Past Surgical History History reviewed. No pertinent surgical history. Family History No family history on file.  Social History Social History   Tobacco Use  . Smoking status: Not on file  . Smokeless tobacco: Never Used  Substance Use Topics  . Alcohol use: Never    Frequency: Never  . Drug use: Never   Allergies Patient has no known allergies.  Review of  Systems Review of Systems  HENT: Negative for rhinorrhea.   Respiratory: Negative for cough, hemoptysis and sputum production.   Cardiovascular: Negative for chest pain, claudication and leg swelling.   All other systems are reviewed and are negative for acute change except as noted in the HPI   Physical Exam Vital Signs  I have reviewed the triage vital signs BP 130/81   Pulse 70   Temp 97.9 F (36.6 C) (Oral)   Resp 20   SpO2 100%   Physical Exam  Constitutional: She is oriented to person, place, and time. She appears well-developed and well-nourished. No distress.  HENT:  Head: Normocephalic and atraumatic.  Nose: Nose normal.  Eyes: Pupils are equal, round, and reactive to light. Conjunctivae and EOM are normal. Right eye exhibits no discharge. Left eye exhibits no discharge. No scleral icterus.  Neck: Normal range of motion. Neck supple.  Cardiovascular: Normal rate  and regular rhythm. Exam reveals no gallop and no friction rub.  No murmur heard. Pulmonary/Chest: Effort normal and breath sounds normal. No stridor. No respiratory distress. She has no rales.  Abdominal: Soft. She exhibits no distension. There is no tenderness.  Musculoskeletal: She exhibits no edema or tenderness.  Neurological: She is alert and oriented to person, place, and time.  Skin: Skin is warm and dry. No rash noted. She is not diaphoretic. No erythema.  Psychiatric: She has a normal mood and affect.  Vitals reviewed.   ED Results and Treatments Labs (all labs ordered are listed, but only abnormal results are displayed) Labs Reviewed  COMPREHENSIVE METABOLIC PANEL - Abnormal; Notable for the following components:      Result Value   CO2 21 (*)    Glucose, Bld 118 (*)    Creatinine, Ser 1.01 (*)    GFR calc non Af Amer 60 (*)    All other components within normal limits  URINALYSIS, ROUTINE W REFLEX MICROSCOPIC - Abnormal; Notable for the following components:   Color, Urine STRAW (*)     Specific Gravity, Urine 1.004 (*)    pH 9.0 (*)    Leukocytes, UA TRACE (*)    All other components within normal limits  LIPASE, BLOOD  CBC  I-STAT TROPONIN, ED                                                                                                                         EKG  EKG Interpretation  Date/Time:  Wednesday February 05 2018 02:57:33 EDT Ventricular Rate:  76 PR Interval:  202 QRS Duration: 86 QT Interval:  394 QTC Calculation: 443 R Axis:   23 Text Interpretation:  Normal sinus rhythm Normal ECG NO STEMI No old tracing to compare Confirmed by Drema Pry [phone]) on 02/05/2018 4:38:22 AM      Radiology Dg Chest 2 View  Result Date: 02/05/2018 CLINICAL DATA:  56 year old female with shortness of breath. EXAM: CHEST - 2 VIEW COMPARISON:  None. FINDINGS: The heart size and mediastinal contours are within normal limits. Both lungs are clear. The visualized skeletal structures are unremarkable. IMPRESSION: No active cardiopulmonary disease. Electronically Signed   By: Elgie Collard M.D.   On: 02/05/2018 04:34   Pertinent labs & imaging results that were available during my care of the patient were reviewed by me and considered in my medical decision making (see chart for details).  Medications Ordered in ED Medications  gi cocktail (Maalox,Lidocaine,Donnatal) (30 mLs Oral Given 02/05/18 0508)  Procedures Procedures  (including critical care time)  Medical Decision Making / ED Course I have reviewed the nursing notes for this encounter and the patient's prior records (if available in EHR or on provided paperwork).    Patient presents with shortness of breath described as inability to take a deep breath, resulting in hyperventilation and paresthesias.  Currently asymptomatic.  No respiratory distress.  EKG without acute ischemic  changes or evidence of pericarditis.  Troponin negative.  Doubt cardiac etiology.  Doubt pulmonary embolism.  Chest x-ray clear, without evidence of pneumonia, pulmonary edema, pleural effusions, pneumothorax.  Labs without evidence of leukocytosis or anemia.  No significant electrolyte derangements or renal insufficiency.  No evidence of biliary obstruction or pancreatitis.  Abdominal discomfort likely gastritis.  Given GI cocktail resulting in improved symptoms.  The patient appears reasonably screened and/or stabilized for discharge and I doubt any other medical condition or other [facility] requiring further screening, evaluation, or treatment in the ED at this time prior to discharge.  The patient is safe for discharge with strict return precautions.   Final Clinical Impression(s) / ED Diagnoses Final diagnoses:  SOB (shortness of breath)   Disposition: Discharge  Condition: Good  I have discussed the results, Dx and Tx plan with the patient who expressed understanding and agree(s) with the plan. Discharge instructions discussed at great length. The patient was given strict return precautions who verbalized understanding of the instructions. No further questions at time of discharge.    ED Discharge Orders    None       Follow Up: Doctor de cabecera  Schedule an appointment as soon as possible for a visit        This chart was dictated using voice recognition software.  Despite best efforts to proofread,  errors can occur which can change the documentation meaning.   Nira Connardama, Pedro Eduardo, MD 02/05/18 [phone]

## 2018-02-24 NOTE — Progress Notes (Signed)
Subjective   Patient ID: Nancy Gilbert    DOB: [birth year], 57 y.o. female   MRN: [id number]  CC: "Eyes are painful"  HPI: Nancy Gilbert is a 57 y.o. female who presents to clinic today for the following:  EYE COMPLAINT  Eye symptoms started 2 months ago. Eye involved: both Eye symptom progression: worsening progressively but "have always hurt" Other people with same problem: no Medications tried: no Eye Trauma: no Contact Lens: no Recent eye surgeries: no  Symptoms Itching: a lot Eye discharge or mattering: no Vision impairment: blurring of vision at all distances Photophobia: at times Nose discharge: no Sneezing: no Vomiting: no Rings around lights: no  Chronic intermittent dyspnea: Of note, patient continues to have intermittent episodes of sudden onset shortness of breath with feelings of chest tightness.  She was evaluated in the ED a few weeks ago with a completely normal work-up.  Her symptoms are thought to be related to reflux and she was advised to restart her PPI.  Patient has not taking her omeprazole.  She feels that she may need an inhaler but has no history of asthma or wheezing.  ROS: see HPI for pertinent.  PMFSH: HTN, HLD, R-rotator cuff tear.Surgical history tubal. Family history heart disease, CKD, HTN, HLD, depression.Surgical history tubal. Family history heart disease, DM, CKD, HTN, HLD. Smoking status reviewed. Medications reviewed.  Objective   BP 118/80   Pulse 82   Temp 97.8 F (36.6 C) (Oral)   Wt 163 lb (73.9 kg)   SpO2 99%   BMI 27.12 kg/m  Vitals and nursing note reviewed.  General: well nourished, well developed, NAD with non-toxic appearance HEENT: normocephalic, atraumatic, moist mucous membranes, pink conjunctiva bilaterally with PERRLA, EOMI, absent conjunctival discharge  Neck: supple, non-tender without lymphadenopathy Cardiovascular: regular rate and rhythm without murmurs, rubs, or gallops Lungs:  clear to auscultation bilaterally with normal work of breathing Skin: warm, dry, no rashes or lesions, cap refill < 2 seconds Extremities: warm and well perfused, normal tone, no edema Psych: dysthymic mood, flat affect, tearful and distraught  Assessment & Plan   Eye pain, bilateral Chronic.  Uncertain etiology.  Unlikely corneal abrasion given bilateral involvement.  No signs of infection.  No signs of closed angle glaucoma.  Unable to visualize papilledema.  Vision 20/20 bilaterally with correction.  Suspect dry eyes given chronicity versus allergies. - Ambulatory referral to ophthalmology - Reviewed return precautions  GERD (gastroesophageal reflux disease) Chronic.  Has PPI on med list though not taking.  Recommended by ED during recent visit for noncardiac chest pain. - Given refill for omeprazole 20 mg daily - See plan for dyspnea  Dyspnea Chronic.  Has multiple work-ups with cardiac or pulmonary etiology.  Suspect underlying anxiety may be playing a role versus reflux.  EKG 12-lead reassuring with NSR without ST changes or T wave abnormalities or heart block.  Symptoms alleviated with reassurance and walking patient through deep breathing. - See plan for reflux - Reviewed return precautions - Given albuterol inhaler and schedule for PFT  Orders Placed This Encounter  Procedures  . Ambulatory referral to Ophthalmology    Referral Priority:   Routine    Referral Type:   Consultation    Referral Reason:   Specialty Services Required    Requested Specialty:   Ophthalmology    Number of Visits Requested:   1  . EKG 12-Lead    Ordered by an unspecified provider    Meds ordered this  encounter  Medications  . omeprazole (PRILOSEC) 20 MG capsule    Sig: Take 1 capsule (20 mg total) by mouth daily.    Dispense:  60 capsule    Refill:  0  . albuterol (PROVENTIL HFA;VENTOLIN HFA) 108 (90 Base) MCG/ACT inhaler    Sig: Inhale 2 puffs into the lungs every 6 (six) hours as needed  for wheezing or shortness of breath.    Dispense:  1 Inhaler    Refill:  2    Durward Parcel, DO [facility], PGY-3 02/26/2018, 11:01 AM

## 2018-02-25 ENCOUNTER — Ambulatory Visit (INDEPENDENT_AMBULATORY_CARE_PROVIDER_SITE_OTHER): Payer: BLUE CROSS/BLUE SHIELD | Admitting: Family Medicine

## 2018-02-25 ENCOUNTER — Other Ambulatory Visit: Payer: Self-pay

## 2018-02-25 ENCOUNTER — Encounter: Payer: Self-pay | Admitting: Family Medicine

## 2018-02-25 ENCOUNTER — Ambulatory Visit (HOSPITAL_COMMUNITY)
Admission: RE | Admit: 2018-02-25 | Discharge: 2018-02-25 | Disposition: A | Discharge status: Home / Self Care | Payer: BLUE CROSS/BLUE SHIELD | Source: Ambulatory Visit | Attending: Family Medicine | Admitting: Family Medicine

## 2018-02-25 VITALS — BP 118/80 | HR 82 | Temp 97.8°F | Wt 163.0 lb

## 2018-02-25 DIAGNOSIS — R0600 Dyspnea, unspecified: Secondary | ICD-10-CM

## 2018-02-25 DIAGNOSIS — Z79899 Other long term (current) drug therapy: Secondary | ICD-10-CM | POA: Insufficient documentation

## 2018-02-25 DIAGNOSIS — H5713 Ocular pain, bilateral: Secondary | ICD-10-CM

## 2018-02-25 DIAGNOSIS — I10 Essential (primary) hypertension: Secondary | ICD-10-CM | POA: Diagnosis not present

## 2018-02-25 DIAGNOSIS — E785 Hyperlipidemia, unspecified: Secondary | ICD-10-CM | POA: Diagnosis not present

## 2018-02-25 DIAGNOSIS — K219 Gastro-esophageal reflux disease without esophagitis: Secondary | ICD-10-CM | POA: Insufficient documentation

## 2018-02-25 MED ORDER — OMEPRAZOLE 20 MG PO CPDR: 20.0000 mg | DELAYED_RELEASE_CAPSULE | Freq: Every day | ORAL | 0 refills | Status: DC

## 2018-02-25 MED ORDER — ALBUTEROL SULFATE HFA 108 (90 BASE) MCG/ACT IN AERS: 2.0000 | INHALATION_SPRAY | Freq: Four times a day (QID) | RESPIRATORY_TRACT | 2 refills | Status: DC | PRN

## 2018-02-25 NOTE — Patient Instructions (Signed)
Thank you for coming in to see [place] today. Please see below to review our plan for today's visit.  1.  We will arrange for you to get your pulmonary function testing done here with Dr. Raymondo Band.  In the meantime, you can take 2 puffs of your albuterol up to every 4 hours as needed for shortness of breath or wheezing. 2.  I would like you to start your omeprazole daily for reflux.  This may be the source of your chest discomfort. 3.  I have placed a referral for you to see an eye doctor.  They will contact you to schedule the appointment.  Please call the clinic at [phone] if your symptoms worsen or you have any concerns. It was our pleasure to serve you.  Durward Parcel, DO [facility], PGY-3

## 2018-02-26 DIAGNOSIS — H5713 Ocular pain, bilateral: Secondary | ICD-10-CM | POA: Insufficient documentation

## 2018-02-26 HISTORY — DX: Ocular pain, bilateral: H57.13

## 2018-02-26 NOTE — Assessment & Plan Note (Addendum)
Chronic.  Has PPI on med list though not taking.  Recommended by ED during recent visit for noncardiac chest pain. - Given refill for omeprazole 20 mg daily - See plan for dyspnea

## 2018-02-26 NOTE — Assessment & Plan Note (Signed)
Chronic.  Uncertain etiology.  Unlikely corneal abrasion given bilateral involvement.  No signs of infection.  No signs of closed angle glaucoma.  Unable to visualize papilledema.  Vision 20/20 bilaterally with correction.  Suspect dry eyes given chronicity versus allergies. - Ambulatory referral to ophthalmology - Reviewed return precautions

## 2018-02-26 NOTE — Assessment & Plan Note (Signed)
Chronic.  Has multiple work-ups with cardiac or pulmonary etiology.  Suspect underlying anxiety may be playing a role versus reflux.  EKG 12-lead reassuring with NSR without ST changes or T wave abnormalities or heart block.  Symptoms alleviated with reassurance and walking patient through deep breathing. - See plan for reflux - Reviewed return precautions - Given albuterol inhaler and schedule for PFT

## 2018-02-27 ENCOUNTER — Ambulatory Visit: Payer: BLUE CROSS/BLUE SHIELD | Attending: Radiology | Admitting: Physical Therapy

## 2018-02-27 ENCOUNTER — Ambulatory Visit (INDEPENDENT_AMBULATORY_CARE_PROVIDER_SITE_OTHER): Payer: BLUE CROSS/BLUE SHIELD | Admitting: Family Medicine

## 2018-02-27 ENCOUNTER — Ambulatory Visit: Payer: BLUE CROSS/BLUE SHIELD | Admitting: Licensed Clinical Social Worker

## 2018-02-27 ENCOUNTER — Other Ambulatory Visit: Payer: Self-pay

## 2018-02-27 VITALS — HR 78 | Temp 98.0°F | Ht 65.0 in | Wt 161.0 lb

## 2018-02-27 DIAGNOSIS — F3289 Other specified depressive episodes: Secondary | ICD-10-CM

## 2018-02-27 DIAGNOSIS — F39 Unspecified mood [affective] disorder: Secondary | ICD-10-CM | POA: Diagnosis not present

## 2018-02-27 DIAGNOSIS — R410 Disorientation, unspecified: Secondary | ICD-10-CM

## 2018-02-27 LAB — POCT URINALYSIS DIP (MANUAL ENTRY)
BILIRUBIN UA: NEGATIVE
Blood, UA: NEGATIVE
Glucose, UA: NEGATIVE mg/dL
LEUKOCYTES UA: NEGATIVE
Nitrite, UA: NEGATIVE
PH UA: 7.5 (ref 5.0–8.0)
Protein Ur, POC: NEGATIVE mg/dL
SPEC GRAV UA: 1.02 (ref 1.010–1.025)
Urobilinogen, UA: 0.2 E.U./dL

## 2018-02-27 MED ORDER — VENLAFAXINE HCL ER 75 MG PO CP24: 75.0000 mg | ORAL_CAPSULE | Freq: Every day | ORAL | 0 refills | Status: DC

## 2018-02-27 NOTE — Progress Notes (Addendum)
[facility] Medications were reviewed with the patient's daughter, including name, instructions, indication, goals of therapy, potential side effects, importance of adherence, and safe use.  Discrepancies were noted between patient-reported medications and medical record. Daughter reports patient is taking enalapril-HCTZ, aspirin 81 mg, and albuterol, but unsure about atorvastatin or metoprolol. She states patient is not taking omeprazole or gabapentin. Advised daughter to bring all medications to follow up appointment.  Recommendations: increase atorvastatin to 40 mg daily; evaluate for compelling indications supporting albuterol (PFT) and metoprolol (CV conditions) continuation, consider discontinuation in the future if not needed.  Patient verbalized understanding by repeating back information and was advised to contact clinic if medication-related questions arise. Recommended venlafaxine XR for depression due to faster onset, prescriptions sent.

## 2018-02-27 NOTE — Therapy (Signed)
[facility] [address] [place], [place], [address] Phone: [phone]   Fax:  [phone]  Patient Details  Name: Nancy Gilbert MRN: [id number] Date of Birth: 08-11-1960 Referring Provider:  Wendee Beavers, DO  Encounter Date: 02/27/2018  Pt was scheduled to be seen in Geriatric Clinic today. MD and Social Work agree PT Screen not appropriate at this time.  Percell Boston, PT 02/27/2018, 3:24 PM  [facility] [address] [place], [place], [address] Phone: [phone]   Fax:  [phone]

## 2018-02-27 NOTE — BH Specialist Note (Signed)
Integrated Behavioral Health Warm Handoff  MRN: [id number] Name: Nancy Gilbert  Session Start time: 2:10  Session End time: 2:40 Total time: 30 minutes  Type of Service: Integrated Behavioral Health Interpretor:Yes.   Interpretor Name and Language: Nancy Gilbert, Patient's daughter   Warm Hand Off Completed.     SUBJECTIVE: Nancy Gilbert is a 57 y.o. female referred by Dr. McDiarmid for assessing of possible depression.  Patient in room with daughter, does not want interpretor to assist with visit. Daughter Nancy Gilbert as Engineer, technical sales. Report of symptoms: crying during visit does not make eye contact, does not recognize husband, did not know where she was at home yesterday, does she know where she is today. Patient denies feeling sad.  ASSESSMENT: Mood: Depressed and Affect: Depressed and Tearful Patient is currently experiencing symptoms of mood disorder. Patient may benefit from further assessment to obtain additional information. PLAN / GOALS: McDiarmid would like to get patient connected to psychiatry for further evaluation.  Discussed possible psychiatric options with patient's daughter. 1. Patient would like to understand why she is confused and does not know where she is.   2. Patient will F/U with Dr. McDiarmid in 1 week.  3. Daughter will do a walk-in at [facility] if needed.  _______________________________________________________  Duration of CURRENT symptoms:per daughter, this started yesterday Age of onset of first mood disturbance:NA Impact on function:unable to do things she enjoys, does not know who her husband is Psychiatric History - Diagnoses:memory loss unknown cause, possible mood disorder - Hospitalizations:  none - Pharmacotherapy: none - Outpatient therapy: none Family history of psychiatric issues:not assessed Current and history of substance use:not assessed Risk of harm to self or others: No plan to harm self or others Other: none identified  (Consider trauma, interpersonal violence)  LIFE CONTEXT: Family and Social: lives with daughter, son-in-law, husband and grandchildren, home during the day with husband and son-in-law. Moved from [place] to [place] in 2017.  Per daughter has been happy [place]. School/Work: none Self-Care: likes to read bible, go to church, shop and cook. Planning a trip to [place] Dec. 2019  Life Changes: none identified from patient or daughter  INTERVENTION:  Supportive Counseling,  Screening Tool(s)  Administered and Consult MD  geriatric depression scale 5( greater than 5 is suggestive of depression.   Sammuel Hines, LCSW Behavioral Health Clinician [facility]   [phone] 11:28 AM

## 2018-02-27 NOTE — Therapy (Deleted)
[facility] [address] [place], [place], [address] Phone: [phone]   Fax:  [phone]  Patient Details  Name: Nancy Gilbert MRN: [id number] Date of Birth: [birth year] Referring Provider:  Wendee Beavers, DO  Encounter Date: 02/27/2018  Pt was scheduled to be seen in Geriatric Clinic today. MD and Social Work agree PT Screen not appropriate at this time.  Percell Boston, PT 02/27/2018, 3:21 PM  [facility] [address] [place], [place], [address] Phone: [phone]   Fax:  [phone]

## 2018-02-27 NOTE — Patient Instructions (Addendum)
Nancy Gilbert appears to be having a episode of sadness and memory loss that came on very quickly. When sadness comes on very quickly and changes how people behave, we want to make sure it is not from an illness.  We are doing blood tests and urine tests to look for an illness in Nancy Gilbert.  If we do not find that Nancy Gilbert if having an illnes to cause her sadness, we are looking into ways to have her evaluated by mental health specialists to see what we can do to help her tears, sadness memory, and change in how she acts.   Please take Nancy Ardell Isaacs to the [facility] acute walk-in clinic tomorrow morning to find mental health specialists to help with our care plan.     In meantime, We are recommending starting a medicine for sadness called Effexor (Venlafaxine). We will increase the dose in about a week.  Taking the medicine as directed and not missing any doses is one of the best things you can do to treat your depression.  Here are some things to keep in mind:  1) Side effects (stomach upset, some increased anxiety) may happen before you notice a benefit.  These side effects typically go away over time. 2) Changes to your dose of medicine or a change in medication all together is sometimes necessary 3) Most people need to be on medication at least 6-12 months 4) Many people will notice an improvement within two weeks but the full effect of the medication can take up to 6-8 weeks 5) Stopping the medication when you start feeling better often results in a return of symptoms 6) If you start having thoughts of hurting yourself or others after starting this medicine, please call me at [phone] immediately.    Please return to see Dr English Tomer at the [facility] in one week.

## 2018-02-27 NOTE — Progress Notes (Addendum)
History of Present Illness:  Much pf PMH and recent events obtained from Dgt, Byrd Hesselbach, with whom the patient lives along with her husband, pt's son, and her grandchildren.   Patient and Husband are both from the [place]. Pt is non-English speaking.  Interview conducted using a  Runner, broadcasting/film/video for Spanish.  Byrd Hesselbach speaks [place], though rephrasing frequently required to communicate adequately in [place].   Acute Mental Status change - Seen ov Dr Abelardo Diesel 12/16/17 c/o memory loss. Losing money, forgetting taking meds, where she is driving. Pt became tearful during exam.  Dementia labs CMET HIV, RPR, Vit D, Vit B12, TSH WNL.  Later Brain MRI on 01/25/18 was WNL.  - Seen ov Dr Abelardo Diesel 01/20/18 c/o memory loss.  His note refers to a MoCA score of 12 / 30 though I did not find it in work search of patient's EMR.  OV Dx was Depressive state. Pt unable complete PHQ9 but did endorse feelings of depression.  -  Day prior to this ov, dgt reports that her mother stop doing her usual activities of housekeeping, shopping, looking after her grandchildren, etc..  She stopped eating, started crying frequently, started saying she did not know where she was, she said she did not recognize her husband or son, she thought her husband was her cousin.  Geriatric Depression Scale conducted in Spanish result: 5 out of 15 (minimal depressive symptoms) though questions would appear to provoke patient's crying   Associated Signs/Symptoms: Depression Symptoms:  depressed mood, psychomotor retardation, fatigue, impaired memory, loss of energy/fatigue, disturbed sleep, decreased appetite,   Anxiety Symptoms:   bitemporal headache, chronic  Psychotic Symptoms:   None noted (Alteration in thought process)  Safety at Home (interview conducted without dgt present) Denies trauma, unsafe feeling at home, forced to do something she did not want to do. Denies hitting, slapping, kicking. No verbal abuse.  Dgt concurred  with these denials in separate interview.       Past Psychiatric History:  No reported psychological or psychiatric signs or symptoms such as difficulty sleeping, anxiety, depression, delusions or hallucinations (schizophrenial), mood swings (bipolar disorders) or suicidal ideations or attempts.  has had no previous counseling.   Previous antidepressant med therapy: none Previous anxiolytic med therapy: none Prior antipsychotic med therapy: none     Is the patient at risk to self? no    Is the patient a risk to others? no     Prior Inpatient Therapy: no    Prior Outpatient Therapy: no     Alcohol Screening: No alcohol  Substance Abuse History in the last 12 months:  no      Past Psychiatric History/Hospitalization(s): no  History of:  Anxiety: no  Bipolar Disorder: no  Depression: no Mania: no  Psychosis: no Schizophrenia: no Personality Disorder: no  Hospitalization for psychiatric illness: no  History of Electroconvulsive Shock Therapy: no  Prior Suicide Attempts: no   Physical Exam Pt would start crying during interview with physician and at the during of the geriatric depression scale questioning.  VS reviewed HEENT: PERRL; COR: RRR, No M LUNGS: BCTA, No Acc mm use,  ABDOMEN: (+)BS, soft, NT, ND,  Gait: Normal speed, No significant path deviation, Step through + Psych:  Appearance: clean, simple clothing and stocking cap. Eye Contact: very poor Attitude: withdrawn, drowsy Speech: Spontaneous: none, slow Rate, low Tone,  low Volume,  long Latency  Attention: not distractable but difficult to get engaged Orientation: states she does not know where she is, would  not/could not state her Mood: depressed Affect:  Flat, blunted Thought Process: simple one word or short one sentence responses,  not Goal Directed  Perception: does Denies AVH,  No AVH Apparent  Thought Content: simple, Denies SI/HI;  no apparent delusions.  Insight:not Good  Judgement:  unknown Cognition: unknown She would not use her dgt's name, knew she had grandchildren who she has cared for daily but would not/could not recall her name, she denied being married.   She stopped reading Bible which is an activity she enjoys.    60 minutes face to face where spent in total with counseling / coordination of care took more than 30 minutes of the total time. The problem list and recommendations for the patient was discussed among the four disciplines prior to meeting with the patient and their family/friend/caretaker.  Each discipline meet with the patient and family/friend/caretaker to assess the issue particular to their disciplines.   Counseling involved a meeting of each of the three disciplines (Medicine, Pharmacy, Social Work) with patient's daughter, Byrd Hesselbach,  to discuss their finding, and recommendations.

## 2018-02-27 NOTE — Assessment & Plan Note (Addendum)
Acute change in behavior with physical signs of depression (tearful, withdrawn, psychomotor slowing, vegetative signs).  Acute memory change: Inconsistent memory retrieval.  She can recall how many grandchildren, but not how many children.  She cannot recall being married, but recalls that the person who brought her to our clinic is her daughter. She says she is "from far away," but cannot recell the name of the country ([place]    No identified stressor per daughter.  No physical/verbal abuse at home per patient and per daughter (interviewed separately.) No thoughts/plans for self harm or harm of others.   Brain MRI 01/25/18 WNL;  Normal cmet, cbc, tsh, rpr,hiv, vit b12 12/16/17  Will first look for an acute general medical condition such as metabolic, drug-related, infectious. Lower suspicion for acute intra-cranial process given grossly normal neuro exam and recent normal brain MRI.    Does not appear to be delirium as patient attended during interview.     It is difficult to say whether her acute worsening of memory impairment and relative mutism is volitional, unconscious, or organic illness.      Normal CMET except for slight rise in serum calcium to 10.6 which is not clinically significant.  Normal CBC. ESR 3 mm/hr WNL NH3 pending but suspicion for hyperammonemia is low.  UDS detected no illicit drugs.   It would be difficult to assess patient for referred chief complaint of possible early-onset dementia currently.  This question will have to be put off for now.  Asked dgt to take patient as walk-in to [facility] tomorrow for acute evaluation.  Ddx is broad: acute medical illness (suspect this is unlikely), Depression (NOS), Adjustment disorder, PTSD, bereavement, Cognitive Disorder (NOS), Conversion Disorder, among others.      Suspect the medical treatment least likely cause mischeif would be trial of SS/NRI.  Will try Effexor XL with titration from 75 mg daily to 150 mg daily.   I will follow up patient next week.  I gave dgt indications to bring patient back, including dehydration, increasing fatigue/sleepiness, fever, etc..  I will see patient in office next Thursday.

## 2018-02-28 ENCOUNTER — Telehealth: Payer: Self-pay | Admitting: Licensed Clinical Social Worker

## 2018-02-28 ENCOUNTER — Encounter: Payer: Self-pay | Admitting: Family Medicine

## 2018-02-28 LAB — CBC
Hematocrit: 44.1 % (ref 34.0–46.6)
Hemoglobin: 15 g/dL (ref 11.1–15.9)
MCH: 29.5 pg (ref 26.6–33.0)
MCHC: 34 g/dL (ref 31.5–35.7)
MCV: 87 fL (ref 79–97)
PLATELETS: 267 10*3/uL (ref 150–450)
RBC: 5.09 x10E6/uL (ref 3.77–5.28)
RDW: 12.7 % (ref 12.3–15.4)
WBC: 7.4 10*3/uL (ref 3.4–10.8)

## 2018-02-28 LAB — CMP14+EGFR
ALBUMIN: 4.7 g/dL (ref 3.5–5.5)
ALT: 18 IU/L (ref 0–32)
AST: 20 IU/L (ref 0–40)
Albumin/Globulin Ratio: 1.5 (ref 1.2–2.2)
Alkaline Phosphatase: 81 IU/L (ref 39–117)
BUN / CREAT RATIO: 14 (ref 9–23)
BUN: 13 mg/dL (ref 6–24)
Bilirubin Total: 0.4 mg/dL (ref 0.0–1.2)
CALCIUM: 10.6 mg/dL — AB (ref 8.7–10.2)
CO2: 27 mmol/L (ref 20–29)
Chloride: 99 mmol/L (ref 96–106)
Creatinine, Ser: 0.9 mg/dL (ref 0.57–1.00)
GFR calc Af Amer: 81 mL/min/{1.73_m2} (ref >59)
GFR, EST NON AFRICAN AMERICAN: 70 mL/min/{1.73_m2} (ref >59)
GLOBULIN, TOTAL: 3.2 g/dL (ref 1.5–4.5)
Glucose: 93 mg/dL (ref 65–99)
Potassium: 3.9 mmol/L (ref 3.5–5.2)
SODIUM: 144 mmol/L (ref 134–144)
TOTAL PROTEIN: 7.9 g/dL (ref 6.0–8.5)

## 2018-02-28 LAB — AMMONIA: Ammonia: 44 ug/dL (ref 19–87)

## 2018-02-28 LAB — SEDIMENTATION RATE: SED RATE: 3 mm/h (ref 0–40)

## 2018-02-28 NOTE — Progress Notes (Signed)
  [facility] Medication Management Phone Note  MRN: [id number] NAME: Evyn Kooyman Time Call Initiated: 11:00 Time Call Completed: 11:15 Total Call Time: 15 minutes  Current Medications:  Outpatient Medications Prior to Visit  Medication Sig Dispense Refill  . acetaminophen (TYLENOL) 500 MG tablet Take 1 tablet (500 mg total) by mouth every 6 (six) hours as needed. 120 tablet 3  . albuterol (PROVENTIL HFA;VENTOLIN HFA) 108 (90 Base) MCG/ACT inhaler Inhale 2 puffs into the lungs every 6 (six) hours as needed for wheezing or shortness of breath. 1 Inhaler 2  . aspirin EC 81 MG tablet Take 81 mg by mouth daily.    Marland Kitchen atorvastatin (LIPITOR) 20 MG tablet Take 1 tablet (20 mg total) by mouth daily. 90 tablet 3  . enalapril-hydrochlorothiazide (VASERETIC) 10-25 MG tablet Take 1 tablet by mouth daily. 90 tablet 3  . fluticasone (FLONASE) 50 MCG/ACT nasal spray Place 2 sprays into both nostrils daily. 16 g 6  . gabapentin (NEURONTIN) 300 MG capsule Take 1 capsule (300 mg total) by mouth 3 (three) times daily. 90 capsule 3  . metoprolol succinate (TOPROL-XL) 100 MG 24 hr tablet Take 1 tablet (100 mg total) by mouth daily. 90 tablet 3  . PRESCRIPTION MEDICATION Take 1 tablet by mouth daily. Cholesterol medication    . venlafaxine XR (EFFEXOR-XR) 75 MG 24 hr capsule Take 1 capsule (75 mg total) by mouth daily with breakfast. 90 capsule 0   No facility-administered medications prior to visit.    Patient has been able to get all medications filled as prescribed: Yes Patient is currently taking all medications as prescribed: Yes Patient reports experiencing side effects: No Patient describes feeling this way on medications: not assessed Additional patient concerns: Daughter states patient is doing better today.She ate dinner yesterday, took effecxor this morning with milk, walked for 20 min. Today.  Slept well last night woke up once around 2:00am wanting to go for a walk but  went back to sleep.  Has not eaten today. No crying today, seems to be more like she usually is. Information obtained from daughter. LCSW Consulted with Dr.  McDiarmid: He does not want patient to go to [facility],  Would like to utilize psychiatric consultation due to delay in availability for psychiatry.     Plan: Patient will continue taking the medication and F/U with Dr. McDiarmid on Thursday 03/06/18 at 9:00. Appointment time and date provided.  Daughter is in agreement for Dr. McDiarmid to move forward with psychiatric consultation.   Sleep: Decreased need for sleep: No Insomnia: No  Substance Use Substance Abuse or Alcohol: No   DUI: No    Abuse: History or Current violence towards others:  No  History or Current intimate partner violence or domestic violence:  No  Physical, sexual or emotional abuse: No   Psychiatric History (Ask if any previous diagnosis/treatment) Past Psychiatric History/Hospitalization(s): No  Anxiety: No  Bipolar Disorder: No  Depression: No       Euphoria: No  Mania: No  Psychosis: No  Schizophrenia: No  Personality Disorder: No   Auditory and/or Visual Hallucinations: No  Self Injurious behaviors: No  Suicide Ideations and/or Homicidal Ideations: No  Prior Suicide Attempts: No   Prior or current outpatient mental health therapy: No  History of Electroconvulsive Shock Therapy: No  Hospitalization for psychiatric illness: No   Family History: Family History of mental illness: No  Family History of substance abuse: No

## 2018-03-03 ENCOUNTER — Other Ambulatory Visit: Payer: Self-pay | Admitting: Pharmacist

## 2018-03-03 DIAGNOSIS — E785 Hyperlipidemia, unspecified: Secondary | ICD-10-CM

## 2018-03-03 MED ORDER — ATORVASTATIN CALCIUM 40 MG PO TABS: 40.0000 mg | ORAL_TABLET | Freq: Every day | ORAL | 3 refills | Status: DC

## 2018-03-04 ENCOUNTER — Telehealth: Payer: Self-pay | Admitting: Licensed Clinical Social Worker

## 2018-03-04 NOTE — Progress Notes (Signed)
Service : [facility] F/U Call  Interpretor:Yes.   Interpretor Name and Language:  Alesia Banda [phone]) Spanish   F/U call to patient and daughter to see how she is doing on day 5 of starting Effexor 75mg .  No answer   Plan: LCSW will wait for return call, if no return call is received, will F/U with patient during visit with provider 03/06/18.   Sammuel Hines, LCSW Behavioral Health Clinician [facility]   [phone] 11:54 AM

## 2018-03-04 NOTE — Progress Notes (Signed)
Increased dose of atorvastatin per PCP approval, tried contacting patient but unable to reach. Notified pharmacy of change.

## 2018-03-05 ENCOUNTER — Telehealth (HOSPITAL_COMMUNITY): Payer: Self-pay | Admitting: Psychiatry

## 2018-03-05 ENCOUNTER — Ambulatory Visit: Payer: Self-pay | Admitting: Licensed Clinical Social Worker

## 2018-03-05 DIAGNOSIS — F3289 Other specified depressive episodes: Secondary | ICD-10-CM

## 2018-03-05 DIAGNOSIS — F39 Unspecified mood [affective] disorder: Secondary | ICD-10-CM

## 2018-03-05 LAB — TOXASSURE SELECT 13 (MW), URINE

## 2018-03-05 NOTE — Telephone Encounter (Addendum)
[facility] brief consult note.   Nancy Gilbert is a 57 y.o. year old female from [place], with a history of hypertension, hyperlipidemia, prediabetes. Patient has been seen at geriatric clinic for the past few years. She has had sudden onset of worsening in cognition overnight. She was unable to tell who she is nor tell her daughter's name. She was reportedly independent in [facility] the day before this episode per patient daughter. No known history of mental illness or trauma. No recent change in medication. No known history of seizure. There may be some family history of memory disorder per chart. She was tearful and had poor eye contact through the interview by the provider. GDS 5/15. No remarkable physical exams. MOCA 12/30 on 12/2017 (details not available) per chart review. Basic lab tests including TSH, vit B 12, RPR, HIV, UDS are unremarkable. MRI with no evidence of atrophy or other organic cause.   Assessment Although her clinical course is quite atypical given this sudden onset, differential includes early onset alzheimer disease (if MOCA score was truly low as documented), Lewy body dementia (fluctuating course), front temporal dementia, depression, catatonia (if there are signs other than mutism to support this), CJD (it is less likely if no other neurological signs, although it progresses rapidly). Repeating MOCA would be helpful to delineate the cause.  Would recommend continuing Effexor as tolerated.   Recommendation  - Repeat MOCA if able - Continue Effexor 75 mg daily. Consider future uptitration as indicated - Check folate  Case discussed with Dr. McDiarmid.

## 2018-03-05 NOTE — BH Specialist Note (Addendum)
Integrated Behavioral Health Treatment Planning Team  MRN: [id number] NAME: Nancy Gilbert  DATE: 03/05/18    Start time: 1:30 End time: 1:50 Total time: 20 minutes  Treatment Team Attendees:Ria Redcay Laurance Flatten, Dr. Modesta Messing, Audry Riles and Dr. Sherren Mocha McDiarmid Scribe for Treatment Team: Maurine Cane, LCSW Presenting Problem/Current Symptoms: Acute change in behavior with physical signs of depression (tearful, withdrawn, psychomotor slowing, vegetative signs).  Diagnoses: No diagnosis found. Psychotropic Medication Management:  1. Medication: Effexor-xr  '75mg'$   Indication: consistent with symptoms of depression  Date started: 02/28/18  Date(s) changed: none  Taking as prescribed: Yes  Positive effects/relief of symptoms: per daughter patient has shown improvement  Negative side effects: none identified at this time  Differential: Depression vs Mood Disorder,early onset alzheimer disease (if MOCA score was truly low as documented), Lewy body dementia (fluctuating course), front temporal dementia, catatonia (if there are signs other than mutism to support this  Team Recommendations: continue Effoxor-Xr 75 mg (titrate) ; administer MOCA at next visit  Referral(s): none at this time  Who else would benefit from hearing team recommendations? will share information with PCP via in-basket and send to Attending physician for sign off  Follow up Appointments: F/U with Dr. Wendy Poet 03/06/18   Medical History:  Past Medical History:  Diagnosis Date  . Essential hypertension 2011  . Hypertension   . Migraines 2006  . Seasonal allergies 2014    Labs:  Recent Results (from the past 2160 hour(s))  RPR     Status: None   Collection Time: 12/16/17 10:15 AM  Result Value Ref Range   RPR Ser Ql Non Reactive Non Reactive  HIV antibody (with reflex)     Status: None   Collection Time: 12/16/17 10:15 AM  Result Value Ref Range   HIV Screen 4th Generation wRfx Non Reactive Non  Reactive  VITAMIN D 25 Hydroxy (Vit-D Deficiency, Fractures)     Status: None   Collection Time: 12/16/17 10:15 AM  Result Value Ref Range   Vit D, 25-Hydroxy 32.9 30.0 - 100.0 ng/mL    Comment: Vitamin D deficiency has been defined by the [place] and an Endocrine Society practice guideline as a level of serum 25-OH vitamin D less than 20 ng/mL (1,2). The Endocrine Society went on to further define vitamin D insufficiency as a level between 21 and 29 ng/mL (2). 1. IOM (Institute of Medicine). 2010. Dietary reference    intakes for calcium and D. [place]: The    [employer]. 2. Holick MF, Binkley [place], Bischoff-Ferrari HA, et al.    Evaluation, treatment, and prevention of vitamin D    deficiency: an Endocrine Society clinical practice    guideline. JCEM. 2011 Jul; 96(7):1911-30.   Vitamin B12     Status: None   Collection Time: 12/16/17 10:15 AM  Result Value Ref Range   Vitamin B-12 406 232 - 1,245 pg/mL  TSH     Status: None   Collection Time: 12/16/17 10:15 AM  Result Value Ref Range   TSH 1.560 0.450 - 4.500 uIU/mL  LDL cholesterol, direct     Status: Abnormal   Collection Time: 12/16/17 10:15 AM  Result Value Ref Range   LDL Direct 163 (H) 0 - 99 mg/dL  Comprehensive metabolic panel     Status: None   Collection Time: 12/30/17 12:04 PM  Result Value Ref Range   Glucose 97 65 - 99 mg/dL   BUN 18 6 - 24 mg/dL   Creatinine,  Ser 0.94 0.57 - 1.00 mg/dL   GFR calc non Af Amer 67 >59 mL/min/1.73   GFR calc Af Amer 77 >59 mL/min/1.73   BUN/Creatinine Ratio 19 9 - 23   Sodium 141 134 - 144 mmol/L   Potassium 3.8 3.5 - 5.2 mmol/L   Chloride 100 96 - 106 mmol/L   CO2 27 20 - 29 mmol/L   Calcium 10.2 8.7 - 10.2 mg/dL   Total Protein 6.9 6.0 - 8.5 g/dL   Albumin 4.2 3.5 - 5.5 g/dL   Globulin, Total 2.7 1.5 - 4.5 g/dL   Albumin/Globulin Ratio 1.6 1.2 - 2.2   Bilirubin Total 0.3 0.0 - 1.2 mg/dL   Alkaline Phosphatase 76 39 - 117 IU/L   AST 24 0 -  40 IU/L   ALT 27 0 - 32 IU/L  Urinalysis, Routine w reflex microscopic     Status: Abnormal   Collection Time: 02/05/18  3:12 AM  Result Value Ref Range   Color, Urine STRAW (A) YELLOW   APPearance CLEAR CLEAR   Specific Gravity, Urine 1.004 (L) 1.005 - 1.030   pH 9.0 (H) 5.0 - 8.0   Glucose, UA NEGATIVE NEGATIVE mg/dL   Hgb urine dipstick NEGATIVE NEGATIVE   Bilirubin Urine NEGATIVE NEGATIVE   Ketones, ur NEGATIVE NEGATIVE mg/dL   Protein, ur NEGATIVE NEGATIVE mg/dL   Nitrite NEGATIVE NEGATIVE   Leukocytes, UA TRACE (A) NEGATIVE   WBC, UA 0-5 0 - 5 WBC/hpf   Bacteria, UA NONE SEEN NONE SEEN   Squamous Epithelial / LPF 0-5 0 - 5    Comment: Performed at [facility], [place] [address] [place], [place] [address]  Lipase, blood     Status: None   Collection Time: 02/05/18  3:17 AM  Result Value Ref Range   Lipase 48 11 - 51 U/L    Comment: Performed at [facility] [address], [place], [place] [address]  Comprehensive metabolic panel     Status: Abnormal   Collection Time: 02/05/18  3:17 AM  Result Value Ref Range   Sodium 139 135 - 145 mmol/L   Potassium 3.5 3.5 - 5.1 mmol/L   Chloride 104 98 - 111 mmol/L   CO2 21 (L) 22 - 32 mmol/L   Glucose, Bld 118 (H) 70 - 99 mg/dL   BUN 13 6 - 20 mg/dL   Creatinine, Ser 1.01 (H) 0.44 - 1.00 mg/dL   Calcium 10.2 8.9 - 10.3 mg/dL   Total Protein 7.3 6.5 - 8.1 g/dL   Albumin 3.9 3.5 - 5.0 g/dL   AST 30 15 - 41 U/L   ALT 25 0 - 44 U/L   Alkaline Phosphatase 66 38 - 126 U/L   Total Bilirubin 0.6 0.3 - 1.2 mg/dL   GFR calc non Af Amer 60 (L) >60 mL/min   GFR calc Af Amer >60 >60 mL/min    Comment: (NOTE) The eGFR has been calculated using the CKD EPI equation. This calculation has not been validated in all clinical situations. eGFR's persistently <60 mL/min signify possible Chronic Kidney Disease.    Anion gap 14 5 - 15    Comment: Performed at [facility] [address], [place] [address]  CBC      Status: None   Collection Time: 02/05/18  3:17 AM  Result Value Ref Range   WBC 9.1 4.0 - 10.5 K/uL   RBC 4.46 3.87 - 5.11 MIL/uL   Hemoglobin 13.5 12.0 -  15.0 g/dL   HCT 40.0 36.0 - 46.0 %   MCV 89.7 78.0 - 100.0 fL   MCH 30.3 26.0 - 34.0 pg   MCHC 33.8 30.0 - 36.0 g/dL   RDW 12.3 11.5 - 15.5 %   Platelets 261 150 - 400 K/uL    Comment: Performed at [facility], [place] [address], [place], [place] [address]  I-stat troponin, ED     Status: None   Collection Time: 02/05/18  3:22 AM  Result Value Ref Range   Troponin i, poc 0.00 0.00 - 0.08 ng/mL   Comment 3            Comment: Due to the release kinetics of cTnI, a negative result within the first hours of the onset of symptoms does not rule out myocardial infarction with certainty. If myocardial infarction is still suspected, repeat the test at appropriate intervals.   CMP14+EGFR     Status: Abnormal   Collection Time: 02/27/18  4:22 PM  Result Value Ref Range   Glucose 93 65 - 99 mg/dL   BUN 13 6 - 24 mg/dL   Creatinine, Ser 0.90 0.57 - 1.00 mg/dL   GFR calc non Af Amer 70 >59 mL/min/1.73   GFR calc Af Amer 81 >59 mL/min/1.73   BUN/Creatinine Ratio 14 9 - 23   Sodium 144 134 - 144 mmol/L   Potassium 3.9 3.5 - 5.2 mmol/L   Chloride 99 96 - 106 mmol/L   CO2 27 20 - 29 mmol/L   Calcium 10.6 (H) 8.7 - 10.2 mg/dL   Total Protein 7.9 6.0 - 8.5 g/dL   Albumin 4.7 3.5 - 5.5 g/dL   Globulin, Total 3.2 1.5 - 4.5 g/dL   Albumin/Globulin Ratio 1.5 1.2 - 2.2   Bilirubin Total 0.4 0.0 - 1.2 mg/dL   Alkaline Phosphatase 81 39 - 117 IU/L   AST 20 0 - 40 IU/L   ALT 18 0 - 32 IU/L  CBC     Status: None   Collection Time: 02/27/18  4:22 PM  Result Value Ref Range   WBC 7.4 3.4 - 10.8 x10E3/uL   RBC 5.09 3.77 - 5.28 x10E6/uL   Hemoglobin 15.0 11.1 - 15.9 g/dL   Hematocrit 44.1 34.0 - 46.6 %   MCV 87 79 - 97 fL   MCH 29.5 26.6 - 33.0 pg   MCHC 34.0 31.5 - 35.7 g/dL   RDW 12.7 12.3 - 15.4 %   Platelets 267 150 - 450 x10E3/uL   Sedimentation Rate     Status: None   Collection Time: 02/27/18  4:22 PM  Result Value Ref Range   Sed Rate 3 0 - 40 mm/hr  Ammonia     Status: None   Collection Time: 02/27/18  4:22 PM  Result Value Ref Range   Ammonia 44 19 - 87 ug/dL  POCT urinalysis dipstick     Status: Abnormal   Collection Time: 02/27/18  4:25 PM  Result Value Ref Range   Color, UA yellow yellow   Clarity, UA clear clear   Glucose, UA negative negative mg/dL   Bilirubin, UA negative negative   Ketones, POC UA trace (5) (A) negative mg/dL   Spec Grav, UA 1.020 1.010 - 1.025   Blood, UA negative negative   pH, UA 7.5 5.0 - 8.0   Protein Ur, POC negative negative mg/dL   Urobilinogen, UA 0.2 0.2 or 1.0 E.U./dL   Nitrite, UA Negative Negative   Leukocytes, UA  Negative Negative  ToxASSURE Select 13 (MW), Urine     Status: None   Collection Time: 02/27/18  4:36 PM  Result Value Ref Range   Summary FINAL     Comment: ==================================================================== TOXASSURE SELECT 13 (MW) ==================================================================== Test                             Result       Flag       Units   NO DRUGS DETECTED. ==================================================================== Test                      Result    Flag   Units      Ref Range   Creatinine              64               mg/dL      >=20 ==================================================================== Declared Medications:  Medication list was not provided. ==================================================================== For clinical consultation, please call [phone]. ====================================================================     Procedures: NA  Social History:  Social History   Socioeconomic History  . Marital status: Married    Spouse name: Not on file  . Number of children: Not on file  . Years of education: Not on file  . Highest education level: Not on  file  Occupational History  . Occupation: unemployed  Social Needs  . Financial resource strain: Not on file  . Food insecurity:    Worry: Not on file    Inability: Not on file  . Transportation needs:    Medical: Not on file    Non-medical: Not on file  Tobacco Use  . Smoking status: Never Smoker  . Smokeless tobacco: Never Used  Substance and Sexual Activity  . Alcohol use: Never    Frequency: Never  . Drug use: Never  . Sexual activity: Not on file  Lifestyle  . Physical activity:    Days per week: Not on file    Minutes per session: Not on file  . Stress: Not on file  Relationships  . Social connections:    Talks on phone: Not on file    Gets together: Not on file    Attends religious service: Not on file    Active member of club or organization: Not on file    Attends meetings of clubs or organizations: Not on file    Relationship status: Not on file  Other Topics Concern  . Not on file  Social History Narrative   ** Merged History Encounter **       School/Education: not assessed Testing: none Psychiatric History: has had no previous counseling Previous Treatment: none Treatment Barriers: possible language and cultural however none identified at this time Strengths/Protective Factors: supportive family Goals: Patient will:  reduce symptoms of depression and return to her previous level of functioning. Interventions: Supportive Counseling  Standardized Assessments Completed:  Geriatric depression scale=5 Medication History: Current medications:  Outpatient Encounter Medications as of 03/05/2018  Medication Sig  . acetaminophen (TYLENOL) 500 MG tablet Take 1 tablet (500 mg total) by mouth every 6 (six) hours as needed.  Marland Kitchen albuterol (PROVENTIL HFA;VENTOLIN HFA) 108 (90 Base) MCG/ACT inhaler Inhale 2 puffs into the lungs every 6 (six) hours as needed for wheezing or shortness of breath.  Marland Kitchen aspirin EC 81 MG tablet Take 81 mg by mouth daily.  Marland Kitchen atorvastatin  (LIPITOR) 40 MG tablet Take 1  tablet (40 mg total) by mouth daily.  . enalapril-hydrochlorothiazide (VASERETIC) 10-25 MG tablet Take 1 tablet by mouth daily.  . fluticasone (FLONASE) 50 MCG/ACT nasal spray Place 2 sprays into both nostrils daily.  Marland Kitchen gabapentin (NEURONTIN) 300 MG capsule Take 1 capsule (300 mg total) by mouth 3 (three) times daily.  . metoprolol succinate (TOPROL-XL) 100 MG 24 hr tablet Take 1 tablet (100 mg total) by mouth daily.  Marland Kitchen PRESCRIPTION MEDICATION Take 1 tablet by mouth daily. Cholesterol medication  . venlafaxine XR (EFFEXOR-XR) 75 MG 24 hr capsule Take 1 capsule (75 mg total) by mouth daily with breakfast.   No facility-administered encounter medications on file as of 03/05/2018.

## 2018-03-06 ENCOUNTER — Ambulatory Visit (INDEPENDENT_AMBULATORY_CARE_PROVIDER_SITE_OTHER): Payer: BLUE CROSS/BLUE SHIELD | Admitting: Licensed Clinical Social Worker

## 2018-03-06 ENCOUNTER — Encounter: Payer: Self-pay | Admitting: Family Medicine

## 2018-03-06 ENCOUNTER — Ambulatory Visit (INDEPENDENT_AMBULATORY_CARE_PROVIDER_SITE_OTHER): Payer: BLUE CROSS/BLUE SHIELD | Admitting: Family Medicine

## 2018-03-06 ENCOUNTER — Other Ambulatory Visit: Payer: Self-pay

## 2018-03-06 DIAGNOSIS — F3289 Other specified depressive episodes: Secondary | ICD-10-CM

## 2018-03-06 DIAGNOSIS — F39 Unspecified mood [affective] disorder: Secondary | ICD-10-CM

## 2018-03-06 DIAGNOSIS — R413 Other amnesia: Secondary | ICD-10-CM | POA: Diagnosis not present

## 2018-03-06 NOTE — BH Specialist Note (Signed)
Integrated Behavioral Health Follow Up Visit  MRN: [id number] Name: Murl Zogg de Gretta Arab  Number of Integrated Behavioral Health Clinician visits: 2/6 Session Start time: 9:30  Session End time: 9:50 Total time: 20 minutes Interpretor:Yes.   ; Name: Hilda Lias (adult daughter) and Language: Spanish. Offered patient interpretor service she wanted to utilize her daughter.  Reason for follow-up: Continue brief intervention and or assessment to assist patient with managing symptoms of depression and behavioral changes . Patient accompanied by her daughter.  Report of symptoms: difficult sleeping, feels dizzy at times when taking med ( discussed with provider). Not talking or interacting with family, mostly sits at home, has not been walking for exercise. This is different from patient baseline per daughter.  Patient reports forgetting where she puts things, forgetting that she ate dinner. Has not had any crying spelling since last week.  ASSESSMENT: Patient is calm and mostly quite, will respond in short sentences when asked questions. Patient is taking medication as prescribed,    States over all she  feel good . Mood: Depressed and Affect: Blunt;  No indication of SI. Patient continues to experience depressed like symptoms.  Daughter shares today that she has noticed a change is patients behavior over the past 6 to 12 months ( forgetting things). Daughter dismissed it thinking it was nothing to be concerned about. PLAN / GOALS:Patient will continue taking current medication per Dr. Perley Jain and F/U in [facility]  Intervention: Motivational Interviewing and Supportive Counseling, Reflective listening, Behavioral Therapy (Relaxed breathing); Consult MD   Not Needed today.   Sammuel Hines, LCSW Behavioral Health Clinician [facility]   [phone] 11:16 AM

## 2018-03-06 NOTE — Patient Instructions (Signed)
Nancy Gilbert appears to be feeling better than last week.  Dr Raahi Korber recommends Nancy Gilbert continue on her Effexor XR 75 mg once a day around 6 AM.  It is not necessary that she takes it with food.  Nancy Gilbert may take one gabapentin capsule at bedtime, if she needs help with her sleep.   We will have Nancy Gilbert return to the [facility] on 11/14 at 3:20 pm to complete the evaluation of her memory problems.

## 2018-03-07 ENCOUNTER — Encounter: Payer: Self-pay | Admitting: Family Medicine

## 2018-03-07 NOTE — Assessment & Plan Note (Signed)
Patient appears more emotionally stable, somewhat less withdrawn from dgt.   Will recommend changing timing of Effexor XR 75 mg to 6 am  To see if this will help with patient's DFA insomnia. We discussed with dgt that if sleep continues to be a difficulty, we could try either trazodone or one gaabpentin tablet at bedtime.  The dgt said she would let us know if she needed to use an agent to help with sleep after trying changing the time of giving the Effexor.

## 2018-03-07 NOTE — Assessment & Plan Note (Signed)
Repeat serum calcium given slight elevation 10.6 on 10/17.

## 2018-03-07 NOTE — Progress Notes (Signed)
I have interviewed and examined the patient.  I have discussed the case and verified the key findings of Sammuel Hines LCSW.   I agree with her assessments and plans as documented in their note for today. The patient's dgt, Byrd Hesselbach, was the primary historian for the visit.   Video Interpretation was offered and declined by the dgt.   A/P Mood Disorder (NOS) - Will apply Mood D/O as nonspecific category for this acute dysruption in patient's usual behavior and emotions.  Still open to other causes though.  - improved from last week ov.  Not tearful.  Calm.  While no spontaneous speech to dgt, her answers to questions are several sentences long.  Her dgt reports that she recognizes family members.   - Only problem with Effexor XR is difficulty falling asleep.  Pt is currently taking Effexor XR at 8-9 am. - There has been no expressions of wanting to harm herself.  - Pt is less physically active than she was before this acute event last week. She is interact with her family at home.   -   Insomnia secondary to medication and psychiatric condition - Will move Effexor XR to 6 am each morning.  Pt's dgt administers the medication.  Memory difficulties - Concerns persist for dgt.  - Will include less typical dementias as Dr Vanetta Shawl recommended yesterday during our phone consultation.  - Will delay testing until patient has had a few weeks of emotional stability.  Will have patient return to Geriatric assessment clinic prior to    25 minutes face to face where spent in total with counseling / coordination of care took more than 50% of the total time. Counseling involved discussing diagnosis, role of medication adherence, risk reduction from treatment, possible adverse effects of medication.  Discussed delaying work-up of patient's long-term memory difficulties for a few weeks of emotional stability for patient. .  Care was coordinated with our Social Worker who also saw patient during his visit.

## 2018-03-07 NOTE — Assessment & Plan Note (Signed)
-   Concerns persist for dgt.  - Will include less typical dementias as Dr Vanetta Shawl recommended yesterday during our phone consultation.  - Will delay testing until patient has had a few weeks of emotional stability.  Will have patient return to Geriatric assessment clinic prior to

## 2018-03-15 ENCOUNTER — Other Ambulatory Visit: Payer: Self-pay | Admitting: Family Medicine

## 2018-03-15 DIAGNOSIS — F3289 Other specified depressive episodes: Secondary | ICD-10-CM

## 2018-03-27 ENCOUNTER — Ambulatory Visit: Payer: BLUE CROSS/BLUE SHIELD

## 2018-04-03 ENCOUNTER — Encounter: Payer: Self-pay | Admitting: Family Medicine

## 2018-04-03 ENCOUNTER — Ambulatory Visit (INDEPENDENT_AMBULATORY_CARE_PROVIDER_SITE_OTHER): Payer: BLUE CROSS/BLUE SHIELD | Admitting: Family Medicine

## 2018-04-03 ENCOUNTER — Other Ambulatory Visit: Payer: Self-pay

## 2018-04-03 VITALS — BP 112/74 | HR 76 | Temp 98.0°F | Wt 164.0 lb

## 2018-04-03 DIAGNOSIS — I10 Essential (primary) hypertension: Secondary | ICD-10-CM | POA: Diagnosis not present

## 2018-04-03 DIAGNOSIS — F5104 Psychophysiologic insomnia: Secondary | ICD-10-CM

## 2018-04-03 DIAGNOSIS — E663 Overweight: Secondary | ICD-10-CM

## 2018-04-03 DIAGNOSIS — G300 Alzheimer's disease with early onset: Secondary | ICD-10-CM | POA: Diagnosis not present

## 2018-04-03 DIAGNOSIS — F39 Unspecified mood [affective] disorder: Secondary | ICD-10-CM

## 2018-04-03 DIAGNOSIS — G44229 Chronic tension-type headache, not intractable: Secondary | ICD-10-CM | POA: Diagnosis not present

## 2018-04-03 DIAGNOSIS — F0280 Dementia in other diseases classified elsewhere without behavioral disturbance: Secondary | ICD-10-CM

## 2018-04-03 MED ORDER — ASPIRIN-ACETAMINOPHEN-CAFFEINE 250-250-65 MG PO TABS: 2.0000 | ORAL_TABLET | Freq: Four times a day (QID) | ORAL | 2 refills | Status: DC | PRN

## 2018-04-03 MED ORDER — ENALAPRIL-HYDROCHLOROTHIAZIDE 10-25 MG PO TABS: 1.0000 | ORAL_TABLET | Freq: Every day | ORAL | 3 refills | Status: DC

## 2018-04-03 MED ORDER — DONEPEZIL HCL 5 MG PO TABS: 5.0000 mg | ORAL_TABLET | Freq: Every day | ORAL | 2 refills | Status: DC

## 2018-04-03 NOTE — Patient Instructions (Signed)
Dr Meri Pelot sent in refills for your blood pressure medicine  He sent in a prescription for your headache medicine that your doctor gave you in the past.   Dr Rigoberto Repass would like to see you back in 3 months to see if the new medicine for your memory problem (dementia) is helping.  If you and your daughter do not think it is helping, then we can stop it.

## 2018-04-04 ENCOUNTER — Other Ambulatory Visit: Payer: Self-pay | Admitting: Family Medicine

## 2018-04-04 ENCOUNTER — Encounter: Payer: Self-pay | Admitting: Family Medicine

## 2018-04-04 DIAGNOSIS — E66811 Obesity, class 1: Secondary | ICD-10-CM | POA: Insufficient documentation

## 2018-04-04 DIAGNOSIS — E663 Overweight: Secondary | ICD-10-CM

## 2018-04-04 DIAGNOSIS — G44229 Chronic tension-type headache, not intractable: Secondary | ICD-10-CM

## 2018-04-04 DIAGNOSIS — F5104 Psychophysiologic insomnia: Secondary | ICD-10-CM | POA: Insufficient documentation

## 2018-04-04 HISTORY — DX: Overweight: E66.3

## 2018-04-04 LAB — BASIC METABOLIC PANEL
BUN / CREAT RATIO: 16 (ref 9–23)
BUN: 14 mg/dL (ref 6–24)
CALCIUM: 10.3 mg/dL — AB (ref 8.7–10.2)
CHLORIDE: 99 mmol/L (ref 96–106)
CO2: 25 mmol/L (ref 20–29)
Creatinine, Ser: 0.87 mg/dL (ref 0.57–1.00)
GFR, EST AFRICAN AMERICAN: 84 mL/min/{1.73_m2} (ref >59)
GFR, EST NON AFRICAN AMERICAN: 73 mL/min/{1.73_m2} (ref >59)
Glucose: 89 mg/dL (ref 65–99)
POTASSIUM: 4.2 mmol/L (ref 3.5–5.2)
SODIUM: 140 mmol/L (ref 134–144)

## 2018-04-04 NOTE — Assessment & Plan Note (Signed)
Adequate blood pressure control.  No evidence of new end organ damage.  Tolerating medication without significant adverse effects.  Plan to continue current blood pressure regiment.   

## 2018-04-04 NOTE — Assessment & Plan Note (Signed)
New diagnosis  04/03/18 MoCA-Basic (5 years or less of formal eduction, Spanish version) administrated with video Spanish interpreter: 14/30 score is well below average.  The patient's daughter reports gradual worsening in her mother's memory over the last year.   Ms Nancy Gilbert is independent in her ADLs, she is not independent in all her iADLs including full dependence in her administration of medications, transportation, and partially dependent in shopping and use of telephone.   It is uncertain how much of Ms Nancy Gilbert impaired iADL functioning is due to her lower cognitive function, and how much it could be related to her Mood Disorder, immigrant status, difficulty in testing across languages and cultures, lower education, personality trait of greater reliance on others to take care of iADL, and cultural custom of caring for older adults.    The diagnosis of dementia at age 57 is not common, at least without a patient having medical conditions associated with impaired cognition and function or signs of Parkinsonism.  Given that there is impairment in both memory and language domains and some apparent level of impairments in most of her iADLs, there may be an Early-onset dementia syndrome, most commonly of Alzheimer's type.   Should there be demonstrable decline in patient's cognitive testing or ADL/iADL functioning, the diagnosis of a dementia syndrome would be more probable.   In the hopes of "bending the curve" of the natural downward trajectory in cognition and function in neurodegenerative syndromes as well as the presence of a possible behavioral and psychological dementia symptoms, a 8733-month trial of Donepezil was recommended.  The GI and cardiac adverse effects of Donepezil was discussed with the patient and her dgt.  Signs and symptoms to recognize these adverse effects were discussed.  We agreed that we would meet again in two months to see if they feel the medication is helping.  If it is, will  continue it for a total of one year.   Questions were encouraged and answered.    Family educational material in Spanish were given to the patient's daughter.  Our LCSW, Sammuel Hineseborah Moore meet with the patient and dgt to discuss community resources for patients with dementia.

## 2018-04-04 NOTE — Assessment & Plan Note (Addendum)
Established problem that has improved.  PHQ-9 = 11 Continue Venlafaxine XR 75 mg daily. Revisit around April next year.  After discussion, no sleep aid was prescribed at dgt's request.  Assess PHQ-SAD next ov for possible component of somatiform issue.

## 2018-04-04 NOTE — Progress Notes (Signed)
[facility]: Interview conducted using Video Interpreter [name], [phone], who was second interpreter bc with the first one we were disconnected) Patient is accompanied by: daughter Primary caregiver: daughter Patient's lives with their family.which includes spouse, dgt, SIL, two grandchildren. Family and patient are from [place] Patient information was obtained from patient, relative(s) and past medical records. History/Exam limitations: dementia and communication barrier Language and lower level of education. Primary Care Provider: [place] Bing, DO Referring provider: Harriet Butte, DO Reason for referral:  Chief Complaint  Patient presents with  . Memory Loss   Previous Report Reviewed: historical medical records and please see  02/27/18 [facility] visit with Dr Lillyian Heidt.  This visit is a second half of a geriatric assessment as patient's mood and disorientation at the 02-27-18 [facility] visit precluded a full consultation.   [name]'s apparent profound sadness and disorientation appear to have significant resolved, allowing her to participate in our evaluation today.     Patient's Care Team No care team member to display  ------------------------------------------------------------------------------------------------------------------------------------------------------------------------------------------------------------------------------------------------------------------------------------------------------------------------------------------------------------------------------------------------------------------------------------------------------------------------------------------------------------   HPI by problems:  Chief Complaint  Patient presents with  . Memory Loss    Cognitive impairment concern  Are there problems with thinking?  memory loss  When were the changes first noticed?  In the last year ago  Did this change occur  abruptly or gradually?  gradual  How have the changes progressed since then?  gradually worsening  Has there been any tremors or abnormal movements?  no  Have they had in hallucinations or delusions:  no  Have they appeared more anxious or sad lately?  yes  Do they still have interests or activities they enjor doing?  yes, grandchildren, shopping, dining out.  [name] is looking forward to visiting her [place] next month.   How has their appetite been lately?  are improving  How has their sleep been lately?  difficulty falling asleep, nightime awakenings and this is a long-standing problem present long before onset of memory issues.  No snoring, No gasping.     Problem behaviors:  Only the abrupt change in personality noted on 02/27/18 that lasted only a couple days.    Compared to 5 to 10 years ago, how is the patient at:  Problems with Judgment, e.g., problem making decisions, bad financial decisions, problems with thinking?  yes, tends not to make decisions   Less interested in hobbies or previously enjoyed activities?  No.  Problem remembering things about family and friends e.g. names,  occupations, birthdays, addresses?  No, but did have a problem transiently last 02/27/18 that lasted only a couple days.  Problem remembering conversations or news events a few days later?  no Problem remembering what day and month it is? no;   Problem with losing things?  Yes, loses cash; show no change  Problem learning to use a new gadget or machine around the house, e.g., cell phones, computer, microwave, remote control?  yes;  Problem with handling money for shopping?  Does not handle cash  Problem handling financial matters, e.g. their pension, checking, credit cards, dealing with the bank?  Does not handle her finances.  Her SIL and Dgt do this for her.  Problem with getting lost in familiar places?  No  Problem with asking the same questions repeatedly or  telling the same story repeatedly to the same person(s)?  yes  Has there been a change in their usual personality?  yes     Behavioral and Psychological Symptoms of Dementia (  relevant or irrelevant): yes If relevant, address whether these symptoms are present:  1. Anxiety:                     Does the patient become upset when separated from the caregiver? no                    Does he/she have any other signs of nervousness such as shortness of breath, sighing, being unable to relax, or feeling excessively tense?                                        no 2.   Irritability/Agitation/Aggression:                     Does the patient become impatient, cranky, have difficulty waiting?  no                    Does the patient resist care like bathing, dressing, eating/feeding? no                    Does the patient strike others? no                    3.   Depression/Dysphoria:                      Does the patient appear sad, tearful, withdrawn, disinterested? yes                     Has patient lost interest in eating?  no.  Is the patient losing or gaining weight?  no 4.   Delusions:                      Does the patient believe that others are stealing from them or planning to hurt them in some way? no 5.   Hallucinations:                     Does the patient hearing voices or does he/she talk to people who are not there? no                    Does the patient seeing things that others do not see? no 6.   Motor disturbances:                     Does the patient do the same thing over and over, like taking clothes out of drawers, pacing, or yelling repeatedly  no                    Has patient gotten lost? no 7.   Disinhibition:                     Does the patient seem to act impulsively, for example, talking to strangers as if he/she knows them? no                    Is the patient verbally abusive to others? no 8.   Nighttime issues:                     Is the patient frequently  awakening at night or getting up too early? Yes,  many years in duration                    Does the patient wander about home at night? no                    Has the patient wandered outside? no   PHQ-9:  11    Outpatient Encounter Medications as of 04/03/2018  Medication Sig  . acetaminophen (TYLENOL) 500 MG tablet Take 1 tablet (500 mg total) by mouth every 6 (six) hours as needed.  Marland Kitchen albuterol (PROVENTIL HFA;VENTOLIN HFA) 108 (90 Base) MCG/ACT inhaler Inhale 2 puffs into the lungs every 6 (six) hours as needed for wheezing or shortness of breath.  Marland Kitchen aspirin EC 81 MG tablet Take 81 mg by mouth daily.  Marland Kitchen aspirin-acetaminophen-caffeine (EXCEDRIN MIGRAINE) 250-250-65 MG tablet Take 2 tablets by mouth every 6 (six) hours as needed for headache.  Marland Kitchen atorvastatin (LIPITOR) 40 MG tablet Take 1 tablet (40 mg total) by mouth daily.  Marland Kitchen donepezil (ARICEPT) 5 MG tablet Take 1 tablet (5 mg total) by mouth at bedtime.  . enalapril-hydrochlorothiazide (VASERETIC) 10-25 MG tablet Take 1 tablet by mouth daily.  . fluticasone (FLONASE) 50 MCG/ACT nasal spray Place 2 sprays into both nostrils daily.  Marland Kitchen gabapentin (NEURONTIN) 300 MG capsule Take 1 capsule (300 mg total) by mouth 3 (three) times daily.  . metoprolol succinate (TOPROL-XL) 100 MG 24 hr tablet Take 1 tablet (100 mg total) by mouth daily.  Marland Kitchen PRESCRIPTION MEDICATION Take 1 tablet by mouth daily. Cholesterol medication  . venlafaxine XR (EFFEXOR-XR) 75 MG 24 hr capsule TAKE 1 CAPSULE BY MOUTH DAILY WITH BREAKFAST , AFTER 4 WEEKS, INCREASE  TO 150 MG ONCE DAILY )  . [DISCONTINUED] enalapril-hydrochlorothiazide (VASERETIC) 10-25 MG tablet Take 1 tablet by mouth daily.   No facility-administered encounter medications on file as of 04/03/2018.     History Patient Active Problem List   Diagnosis Date Noted  . Mood disorder ([place]) 01/20/2018    Priority: High  . Early onset dementia ([place]), Possible 12/16/2017    Priority: High  . Chronic insomnia  04/04/2018    Priority: Low  . Hypercalcemia 02/28/2018    Priority: Low  . Overweight (BMI 25.0-29.9) 04/04/2018  . Eye pain, bilateral 02/26/2018  . Bruising 11/22/2017  . Chronic chest pain 10/25/2017  . GERD (gastroesophageal reflux disease) 10/25/2017  . Tension headache, chronic 02/12/2017  . Tear of right rotator cuff 09/25/2016  . Mixed hyperlipidemia 03/13/2016  . Primary hypertension 02/28/2016   Past Medical History:  Diagnosis Date  . Blurred vision, bilateral 09/25/2016  . Cognitive impairment 12/16/2017  . Dyspnea 03/13/2016  . Early onset dementia ([place]), uncertain nature 0/07/4740  . Essential hypertension 2011  . Hypertension   . Migraines 2006  . Overweight (BMI 25.0-29.9) 04/04/2018  . Seasonal allergies 2014   Past Surgical History:  Procedure Laterality Date  . TUBAL LIGATION Bilateral 1990   Family History  Problem Relation Age of Onset  . Heart disease Mother   . Diabetes Mother   . Kidney disease Mother   . Hypertension Mother   . Hyperlipidemia Mother   . Heart disease Father   . Hypertension Father   . Diabetes Father   . Hyperlipidemia Sister   . Diabetes Sister   . Osteoporosis Sister   . Depression Sister    Social History   Socioeconomic History  . Marital status: Married    Spouse name: Not on file  .  Number of children: Not on file  . Years of education: Not on file  . Highest education level: 8th grade  Occupational History  . Occupation: unemployed  Social Needs  . Financial resource strain: Not on file  . Food insecurity:    Worry: Not on file    Inability: Not on file  . Transportation needs:    Medical: Not on file    Non-medical: Not on file  Tobacco Use  . Smoking status: Never Smoker  . Smokeless tobacco: Never Used  Substance and Sexual Activity  . Alcohol use: Never    Frequency: Never  . Drug use: Never  . Sexual activity: Not on file  Lifestyle  . Physical activity:    Days per week: Not on file     Minutes per session: Not on file  . Stress: Not on file  Relationships  . Social connections:    Talks on phone: Not on file    Gets together: Not on file    Attends religious service: Not on file    Active member of club or organization: Not on file    Attends meetings of clubs or organizations: Not on file    Relationship status: Not on file  Other Topics Concern  . Not on file  Social History Narrative           Cardiovascular Risk Factors: Hypertension, Lipids and Overweight  Educational History: 8 years formal education Personal History of Seizures: No -  Personal History of Stroke: No -  Personal History of Head Trauma: No -  Personal History of Psychiatric Disorders: Yes - Mood Disorder (NOS)  Basic Activities of Daily Living  Dressing: Self-care Eating: Self-care Ambulation: Self-care Toileting: Self-care Bathing: Self-care  Instrumental Activities of Daily Living Shopping: Partial assistance House/Yard Work: Self-care Administration of medications: Total assistance Finances: Total assistance Telephone: Partial assistance Transportation: Total assistance   Caregivers in home: daughter, spouse and SIL.  DGT is primary CT.  Caregiver Stress Self-Assessment (Zarit Score):  0 out of 80 Scoring: 0-20 = Little/No stress       21-40 = Mild/Moderate stress       41-60 = Moderate/Severe stress      61-80 = Severe stress   Formal Home Health Assistance  Physical Therapy: no  Occupational Therapy: no             Home Aid / Personal Care Service: no             Homemaker services: no  FALLS in last five office visits:  Fall Risk  04/04/2018 02/25/2018 01/20/2018 12/30/2017 11/22/2017  Falls in the past year? 0 No No No No  Number falls in past yr: 0 - - - -    Health Maintenance reviewed: Immunization History  Administered Date(s) Administered  . Influenza,inj,Quad PF,6+ Mos 02/28/2016, 01/27/2018  . MMR 03/16/2015  . Td 03/16/2015  . Tdap 03/16/2015  .  Varicella 03/16/2015, 04/13/2015   Health Maintenance Topics with due status: Overdue     Topic Date Due   COLONOSCOPY 07/22/2008   MAMMOGRAM 04/03/2018    Diet: Regular Nutritional supplements: none   ROS denies changes in appetite; denies changes in weight;  Denies changes in vision / hearing; Denies recent falls Denies tremors;  (+) Daily frontal headache (chronic) (+) sadness / anxiety /  Denies suicidal ideation  Vital Signs Weight: 164 lb (74.4 kg) Body mass index is 27.29 kg/m. Estimated Creatinine Clearance: 70.3 mL/min (by C-G formula based on  SCr of 0.87 mg/dL). Body surface area is 1.85 meters squared. Vitals:   04/03/18 0959  BP: 112/74  Pulse: 76  Temp: 98 F (36.7 C)  TempSrc: Oral  SpO2: 99%  Weight: 164 lb (74.4 kg)   Wt Readings from Last 3 Encounters:  04/03/18 164 lb (74.4 kg)  03/06/18 163 lb (73.9 kg)  02/27/18 161 lb (73 kg)     Physical Examination:  VS reviewed GEN: Alert, Cooperative, Groomed, NAD HEENT: PERRL; EAC bilaterally not occluded, TM's translucent with normal LM, (+) LR;                No cervical LAN, No thyromegaly, No palpable masses COR: RRR, No M/G/R, No JVD, Normal PMI size and location LUNGS: BCTA, No Acc mm use, speaking in full sentences ABDOMEN: (+)BS, soft, NT, ND, No HSM, No palpable masses EXT: No peripheral leg edema. Neuro: Oriented to person, place, and time; Strength: 5/5 Bil. UE and LE symmetric; Sensation: Intact grossly to touch all four extremities; Cerebellar: Finger-to-Nose intact, Rhomberg negative; Muscle Tone normal; Tremor not present;  Gait: No significant path deviation, Step-through present  Psych: Normal affect/thought/speech/language   Mini-Mental State Examination or Montreal Cognitive Assessment:  Patient did  require additional cues or prompts to complete tasks. Patient was cooperative and attentive to testing tasks Patient did  appear motivated to perform well  No flowsheet data  found.      Montreal Cognitive Assessment  04/04/2018 04/03/2018  Visuospatial/ Executive (0/5) 2 2  Naming (0/3) 2 2  Attention: Read list of digits (0/2) 1 1  Attention: Read list of letters (0/1) 0 0  Attention: Serial 7 subtraction starting at 100 (0/3) 0 0  Language: Repeat phrase (0/2) 0 0  Language : Fluency (0/1) 0 0  Abstraction (0/2) 1 1  Delayed Recall (0/5) 2 2  Orientation (0/6) 6 6  Total 14 14  Adjusted Score (based on education) 14 14      Labs No components found for: VITAMIND  Lab Results  Component Value Date   VITAMINB12 406 12/16/2017    No results found for: FOLATE  Lab Results  Component Value Date   TSH 1.560 12/16/2017    No results found for: RPR  Lab Results  Component Value Date   HIV Non Reactive 12/16/2017      Chemistry      Component Value Date/Time   NA 140 04/03/2018 1133   K 4.2 04/03/2018 1133   CL 99 04/03/2018 1133   CO2 25 04/03/2018 1133   BUN 14 04/03/2018 1133   CREATININE 0.87 04/03/2018 1133   CREATININE 0.92 03/13/2016 1208      Component Value Date/Time   CALCIUM 10.3 (H) 04/03/2018 1133   ALKPHOS 81 02/27/2018 1622   AST 20 02/27/2018 1622   ALT 18 02/27/2018 1622   BILITOT 0.4 02/27/2018 1622       Lab Results  Component Value Date   HGBA1C 5.6 02/28/2016     '@10RELATIVEDAYS''@No'$  exam data present Lab Results  Component Value Date   WBC 7.4 02/27/2018   HGB 15.0 02/27/2018   HCT 44.1 02/27/2018   MCV 87 02/27/2018   PLT 267 02/27/2018    Results for orders placed or performed in visit on 04/03/18 (from the past 24 hour(s))  Basic Metabolic Panel   Collection Time: 04/03/18 11:33 AM  Result Value Ref Range   Glucose 89 65 - 99 mg/dL   BUN 14 6 - 24 mg/dL  Creatinine, Ser 0.87 0.57 - 1.00 mg/dL   GFR calc non Af Amer 73 >59 mL/min/1.73   GFR calc Af Amer 84 >59 mL/min/1.73   BUN/Creatinine Ratio 16 9 - 23   Sodium 140 134 - 144 mmol/L   Potassium 4.2 3.5 - 5.2 mmol/L   Chloride 99 96  - 106 mmol/L   CO2 25 20 - 29 mmol/L   Calcium 10.3 (H) 8.7 - 10.2 mg/dL    Imaging  Brain MRI: January 25, 2018: CC: Progressive memory loss "Negative exam. No evidence of quantifiable brain atrophy. No evidence of small-vessel disease"  Personal Strengths Physical Health Supportive family/friends  Support System Strengths Supportive Relationships and Family   Advanced Directives Code Status: Not discussed.  Presumed Full Code Advance Directives: none  ------------------------------------------------------------------------------------------------------------------------------------------------------------------------------------------------------------------------------------------------------------------------------------------------------------------------------------------------------------------------------------------------------------------------------------------------------------------------------------------------------------  Assessment and Plan: Please see individual consultation notes from physical therapy, pharmacy and social work for today.    Problem List Items Addressed This Visit      High   Early onset dementia [facility]), Possible - Primary    New diagnosis  04/03/18 MoCA-Basic (5 years or less of formal eduction, Spanish version) administrated with video Spanish interpreter: 14/30 score is well below average.  The patient's daughter reports gradual worsening in her mother's memory over the last year.   Ms Nancy Gilbert is independent in her ADLs, she is not independent in all her iADLs including full dependence in her administration of medications, transportation, and partially dependent in shopping and use of telephone.   It is uncertain how much of Ms Nancy Gilbert impaired iADL functioning is due to her lower cognitive function, and how much it could be related to her Mood Disorder, immigrant status, difficulty in testing across languages and cultures, lower education,  personality trait of greater reliance on others to take care of iADL, and cultural custom of caring for older adults.    The diagnosis of dementia at age 40 is not common, at least without a patient having medical conditions associated with impaired cognition and function or signs of Parkinsonism.  Given that there is impairment in both memory and language domains and some apparent level of impairments in most of her iADLs, there may be an Early-onset dementia syndrome, most commonly of Alzheimer's type.   Should there be demonstrable decline in patient's cognitive testing or ADL/iADL functioning, the diagnosis of a dementia syndrome would be more probable.   In the hopes of "bending the curve" of the natural downward trajectory in cognition and function in neurodegenerative syndromes as well as the presence of a possible behavioral and psychological dementia symptoms, a 52-monthtrial of Donepezil was recommended.  The GI and cardiac adverse effects of Donepezil was discussed with the patient and her dgt.  Signs and symptoms to recognize these adverse effects were discussed.  We agreed that we would meet again in two months to see if they feel the medication is helping.  If it is, will continue it for a total of one year.   Questions were encouraged and answered.    [place] the patient's [place].  Our LCSW, DCasimer Laniusmeet with the patient and dgt to discuss community resources for patients with dementia.       Relevant Medications   donepezil (ARICEPT) 5 MG tablet   Mood disorder (HCC)    Established problem that has improved.  PHQ-9 = 11 Continue Venlafaxine XR 75 mg daily. Revisit around April next year.  After discussion, no sleep aid was  prescribed at dgt's request.  Assess PHQ-SAD next ov for possible component of somatiform issue.           Low   Hypercalcemia    Lab Results  Component Value Date   CALCIUM 10.3 (H) 04/03/2018    Persistent slightly elevated serum calcium. Will check iPTH & Ca++ next ov. Suspect cause is either side-effect of patient's thiazide or low grade primary asymptomatic hyperparathyroidism (or both.)        Relevant Orders   Basic Metabolic Panel (Completed)     Unprioritized   Primary hypertension (Chronic)    Adequate blood pressure control.  No evidence of new end organ damage.  Tolerating medication without significant adverse effects.  Plan to continue current blood pressure regiment.         Relevant Medications   enalapril-hydrochlorothiazide (VASERETIC) 10-25 MG tablet   Tension headache, chronic (Chronic)    Established problem Uncontrolled Patient asking for Fioricet. I suspect component of Medication-Overuse Headache.  Rx Excedrin Migraine for Trip to [place]. Patient may benefit from amitriptyline titrated up to 50 to 75 mg qhs.  Also may help with chroni insomnia.       Relevant Medications   aspirin-acetaminophen-caffeine (EXCEDRIN MIGRAINE) 250-250-65 MG tablet     Early onset dementia [facility]), Possible New diagnosis  04/03/18 MoCA-Basic (5 years or less of formal eduction, Spanish version) administrated with video Spanish interpreter: 14/30 score is well below average.  The patient's daughter reports gradual worsening in her mother's memory over the last year.   Ms Nancy Gilbert is independent in her ADLs, she is not independent in all her iADLs including full dependence in her administration of medications, transportation, and partially dependent in shopping and use of telephone.   It is uncertain how much of Ms Nancy Gilbert impaired iADL functioning is due to her lower cognitive function, and how much it could be related to her Mood Disorder, immigrant status, difficulty in testing across languages and cultures, lower education, personality trait of greater reliance on others to take care of iADL, and cultural custom of caring for older adults.    The diagnosis of  dementia at age 14 is not common, at least without a patient having medical conditions associated with impaired cognition and function or signs of Parkinsonism.  Given that there is impairment in both memory and language domains and some apparent level of impairments in most of her iADLs, there may be an Early-onset dementia syndrome, most commonly of Alzheimer's type.   Should there be demonstrable decline in patient's cognitive testing or ADL/iADL functioning, the diagnosis of a dementia syndrome would be more probable.   In the hopes of "bending the curve" of the natural downward trajectory in cognition and function in neurodegenerative syndromes as well as the presence of a possible behavioral and psychological dementia symptoms, a 1-monthtrial of Donepezil was recommended.  The GI and cardiac adverse effects of Donepezil was discussed with the patient and her dgt.  Signs and symptoms to recognize these adverse effects were discussed.  We agreed that we would meet again in two months to see if they feel the medication is helping.  If it is, will continue it for a total of one year.   Questions were encouraged and answered.    Family educational material in [place] given to the patient's daughter.  Our LCSW, DCasimer Laniusmeet with the patient and dgt to discuss community resources for patients with dementia.   Mood disorder ([place] Established problem that  has improved.  PHQ-9 = 11 Continue Venlafaxine XR 75 mg daily. Revisit around April next year.  After discussion, no sleep aid was prescribed at dgt's request.  Assess PHQ-SAD next ov for possible component of somatiform issue.     Hypercalcemia Lab Results  Component Value Date   CALCIUM 10.3 (H) 04/03/2018   Persistent slightly elevated serum calcium. Will check iPTH & Ca++ next ov. Suspect cause is either side-effect of patient's thiazide or low grade primary asymptomatic hyperparathyroidism (or both.)    Primary  hypertension Adequate blood pressure control.  No evidence of new end organ damage.  Tolerating medication without significant adverse effects.  Plan to continue current blood pressure regiment.     Tension headache, chronic Established problem Uncontrolled Patient asking for Fioricet. I suspect component of Medication-Overuse Headache.  Rx Excedrin Migraine for Trip to [place]. Patient may benefit from amitriptyline titrated up to 50 to 75 mg qhs.  Also may help with chroni insomnia.         Patient to Follow up with  Dr.Ranie Chinchilla in 2 months  > 40 minutes visit with greater than 20 minutes in face to face were spent in total with interdisciplinary discussion, and patient and caretaker counseling on nature of dementia, prognosis, benefits and potential harms of medication. Our LCSW meet with the patient and caretaker to discuss community resources

## 2018-04-04 NOTE — Assessment & Plan Note (Addendum)
Lab Results  Component Value Date   CALCIUM 10.3 (H) 04/03/2018   Persistent slightly elevated serum calcium. Will check iPTH & Ca++ next ov. Suspect cause is either side-effect of patient's thiazide or low grade primary asymptomatic hyperparathyroidism (or both.)

## 2018-04-04 NOTE — Assessment & Plan Note (Addendum)
Established problem Uncontrolled Patient asking for Fioricet. I suspect component of Medication-Overuse Headache.  Rx Excedrin Mig[place] for [place]. Patient may benefit from amitriptyline titrated up to 50 to 75 mg qhs.  Also may help with chroni insomnia.

## 2018-04-07 NOTE — Telephone Encounter (Signed)
This medication was discontinued by me last summer. Please advise patient to take tylenol and ibuprofen as needed. She can schedule a follow-up appt if headache persists.  Durward Parcelavid Nechelle Petrizzo, DO [facility], PGY-3

## 2018-04-08 ENCOUNTER — Telehealth: Payer: Self-pay

## 2018-04-08 NOTE — Telephone Encounter (Signed)
Attempted to call patient with [facility] [employer] [place] ID # [phone]. There was no answer and the voicemail has not been set up.   Glennie Hawk.Simpson, Michelle R, CMA

## 2018-04-14 ENCOUNTER — Other Ambulatory Visit: Payer: Self-pay | Admitting: Family Medicine

## 2018-04-14 DIAGNOSIS — G44229 Chronic tension-type headache, not intractable: Secondary | ICD-10-CM

## 2018-05-06 ENCOUNTER — Encounter

## 2018-06-19 IMAGING — DX DG SHOULDER 2+V*R*
3 series · 3 of 3 positions shown · non-contrast
Comparison: None.

CLINICAL DATA: Right shoulder and neck pain for 2 months. No
history of injury.

EXAM:
RIGHT SHOULDER - 2+ VIEW

[dg shoulder right (1 of 3)]
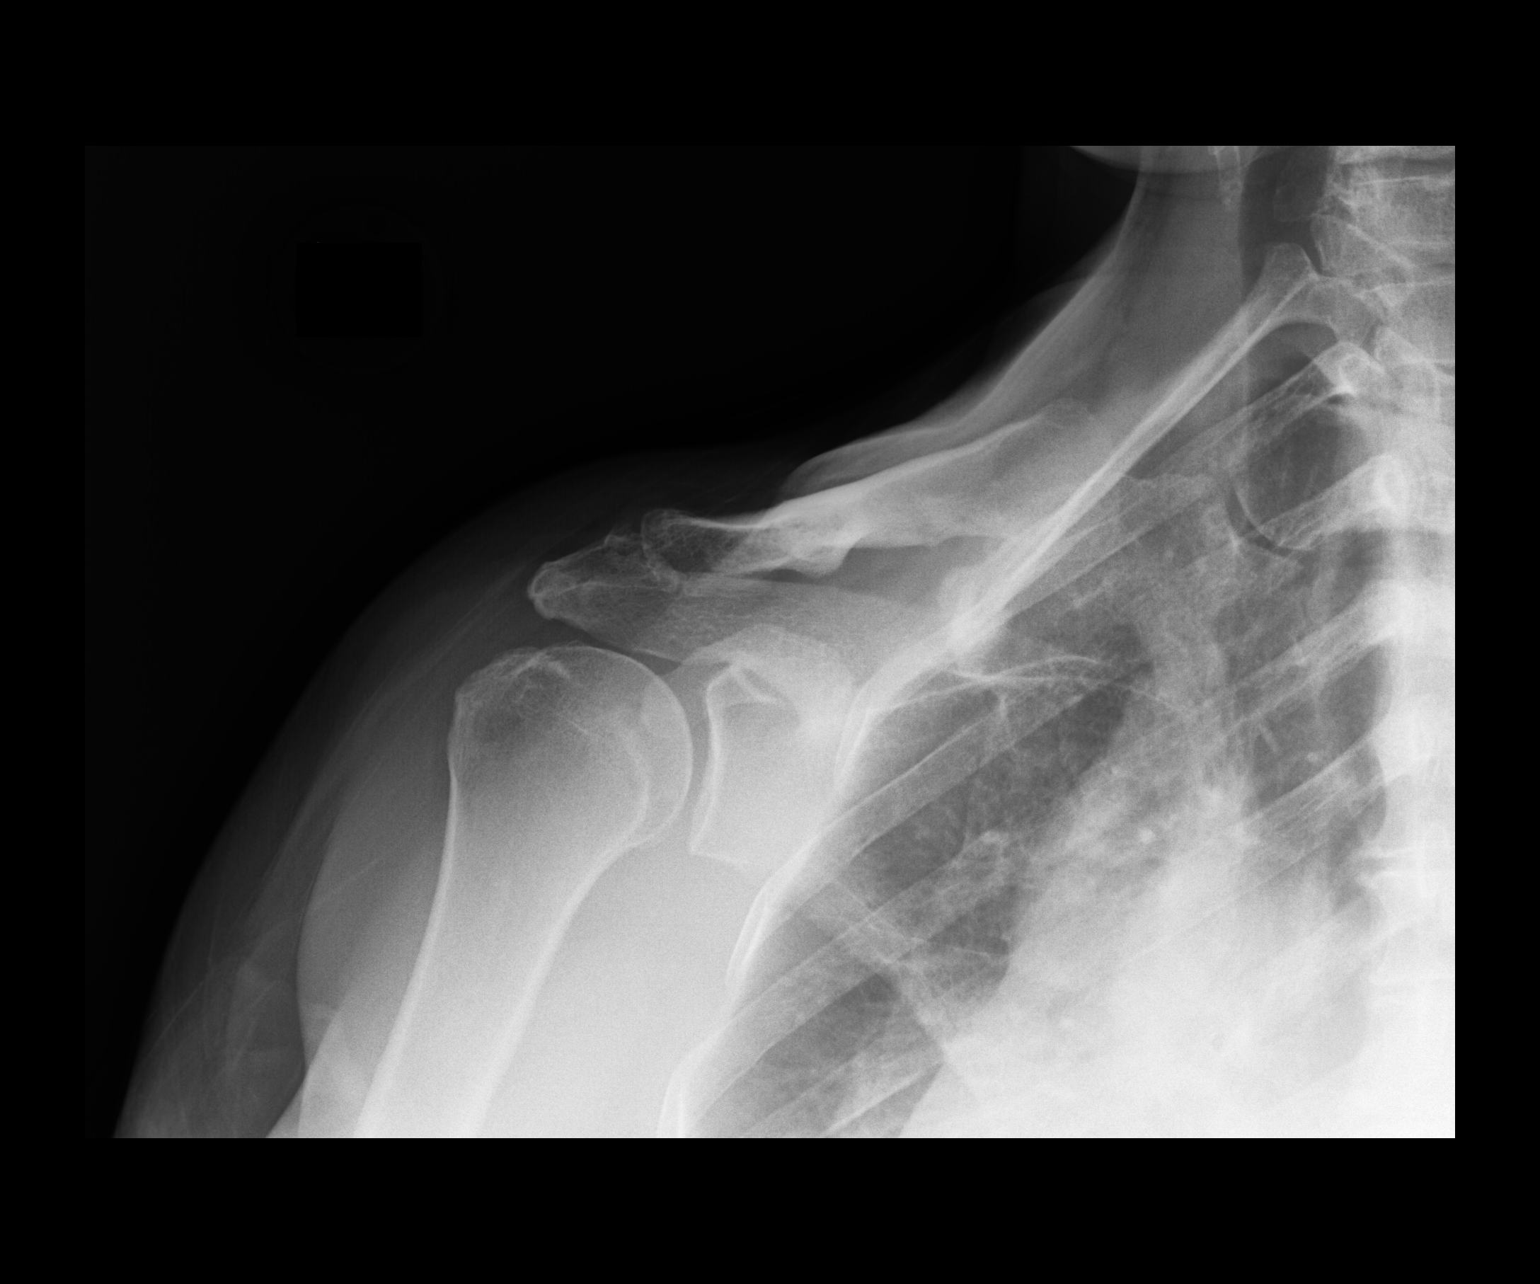

[dg shoulder right (2 of 3)]
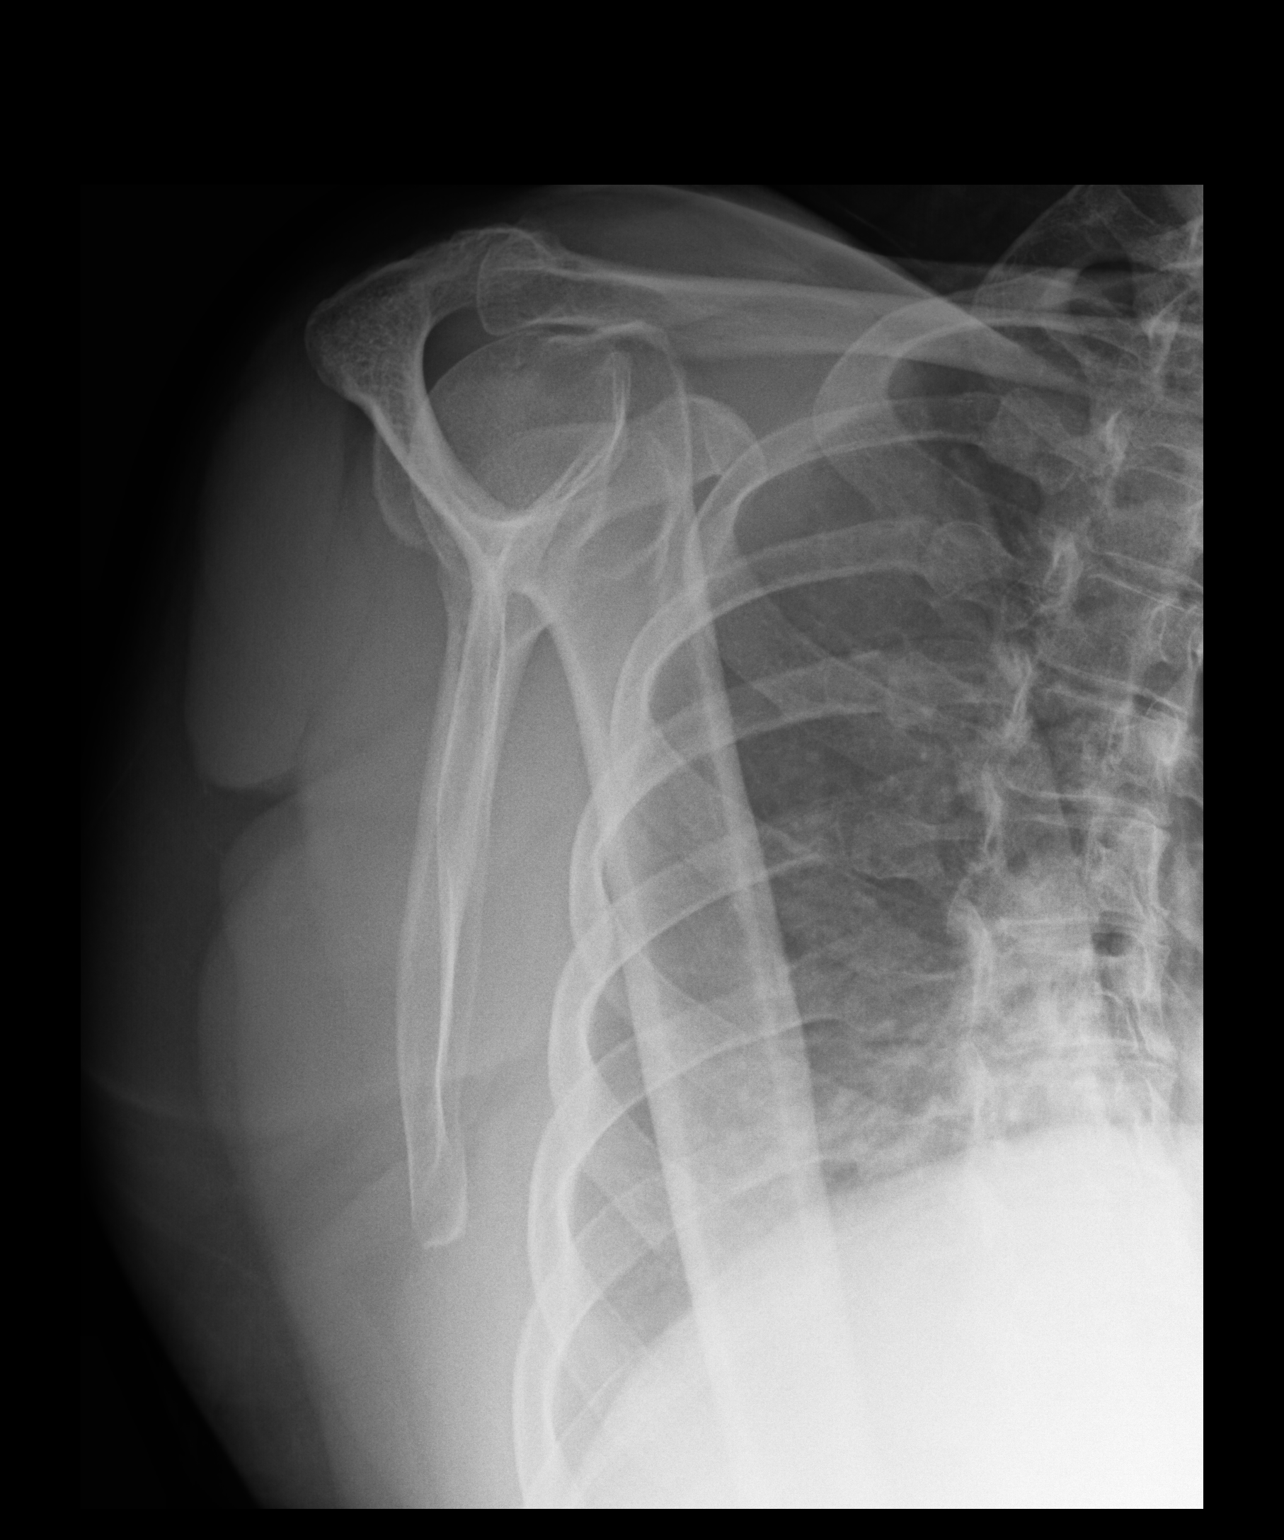

[dg shoulder right (3 of 3)]
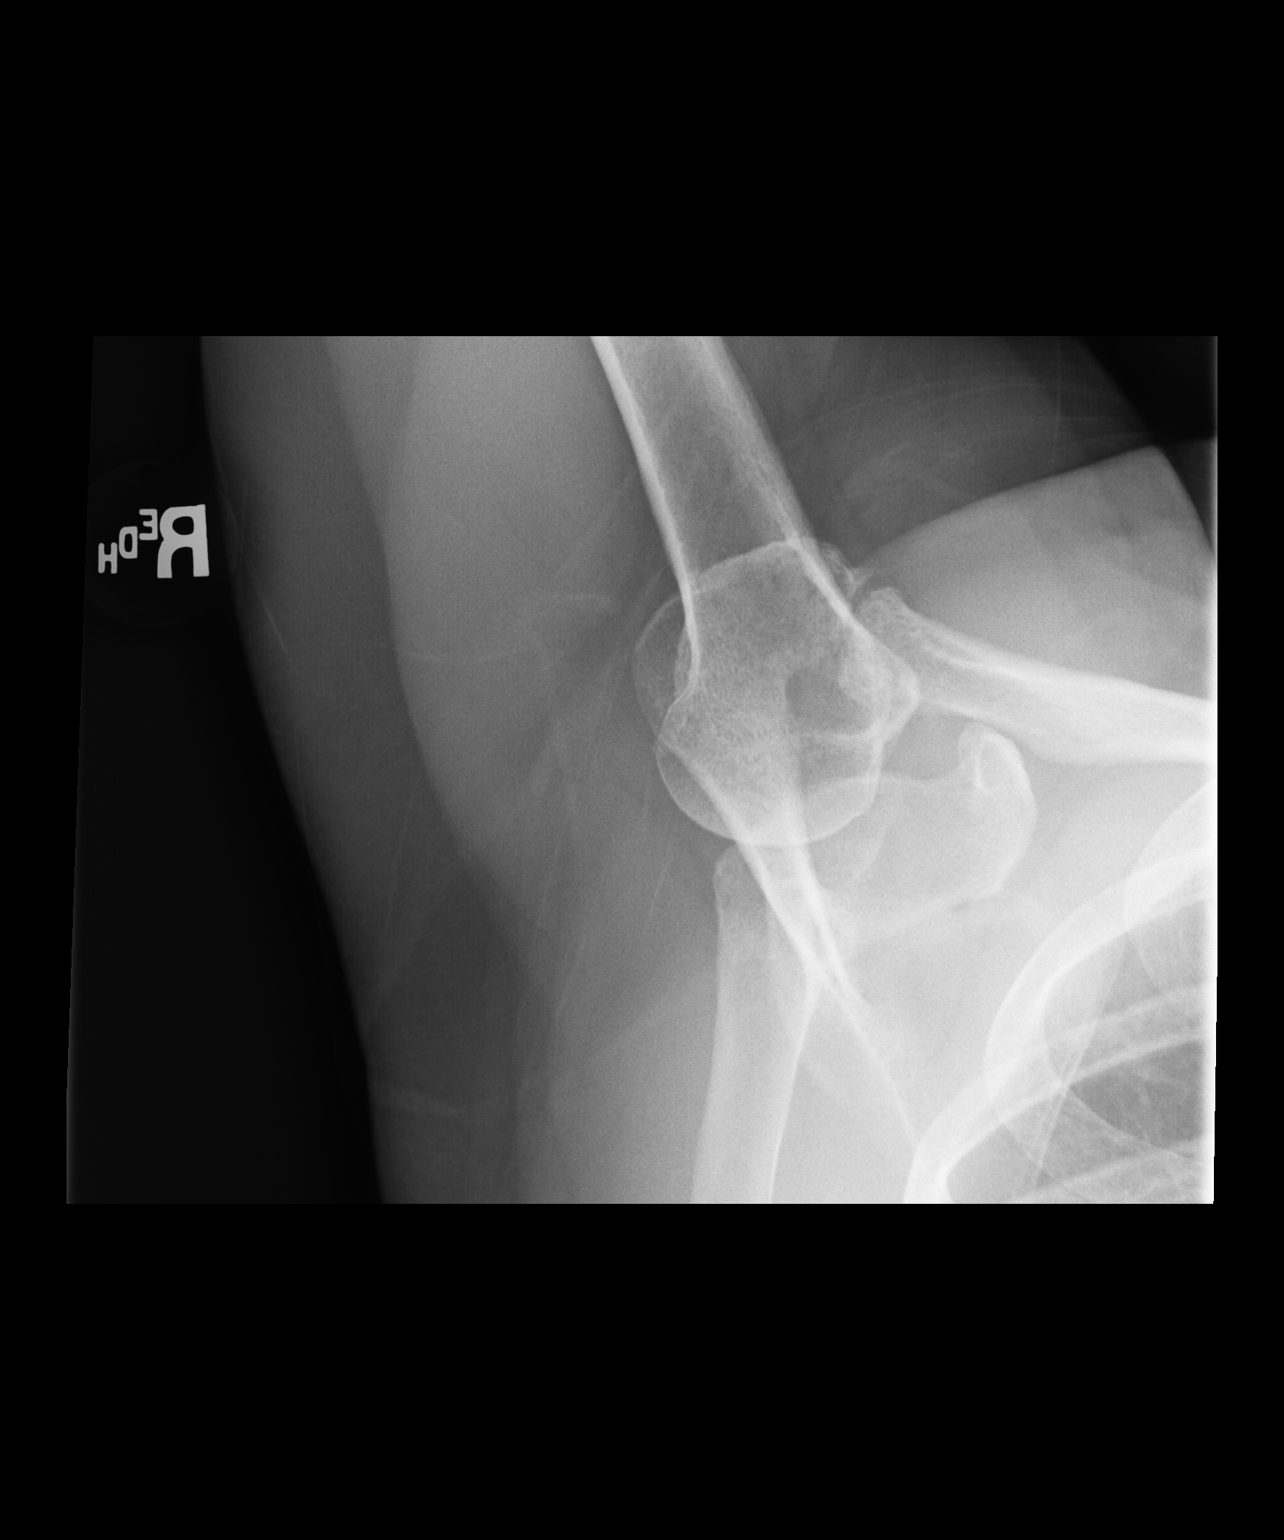

[3 of 3 positions shown; findings below may reference images not displayed]

FINDINGS: No evidence of fracture or dislocation. Degenerative AC joint
narrowing and spurring. No soft tissue calcification. Negative
visualized right chest.
IMPRESSION: 1. No acute finding.
2. AC joint degenerative narrowing and mild spurring.

## 2018-07-25 ENCOUNTER — Other Ambulatory Visit: Payer: Self-pay | Admitting: Family Medicine

## 2018-07-25 DIAGNOSIS — F3289 Other specified depressive episodes: Secondary | ICD-10-CM

## 2018-08-29 ENCOUNTER — Other Ambulatory Visit: Payer: Self-pay

## 2018-08-29 DIAGNOSIS — J029 Acute pharyngitis, unspecified: Secondary | ICD-10-CM

## 2018-08-29 MED ORDER — FLUTICASONE PROPIONATE 50 MCG/ACT NA SUSP: 2.0000 | Freq: Every day | NASAL | 6 refills | Status: DC

## 2018-09-27 ENCOUNTER — Other Ambulatory Visit: Payer: Self-pay | Admitting: Family Medicine

## 2018-09-27 DIAGNOSIS — F3289 Other specified depressive episodes: Secondary | ICD-10-CM

## 2018-10-03 ENCOUNTER — Other Ambulatory Visit: Payer: Self-pay | Admitting: Family Medicine

## 2018-10-03 DIAGNOSIS — F3289 Other specified depressive episodes: Secondary | ICD-10-CM

## 2018-10-30 ENCOUNTER — Other Ambulatory Visit: Payer: Self-pay | Admitting: Family Medicine

## 2018-10-30 DIAGNOSIS — G8929 Other chronic pain: Secondary | ICD-10-CM

## 2018-10-30 DIAGNOSIS — G44229 Chronic tension-type headache, not intractable: Secondary | ICD-10-CM

## 2018-10-30 DIAGNOSIS — I10 Essential (primary) hypertension: Secondary | ICD-10-CM

## 2018-10-30 DIAGNOSIS — R0600 Dyspnea, unspecified: Secondary | ICD-10-CM

## 2018-11-06 ENCOUNTER — Other Ambulatory Visit: Payer: Self-pay

## 2018-11-06 ENCOUNTER — Ambulatory Visit (INDEPENDENT_AMBULATORY_CARE_PROVIDER_SITE_OTHER): Payer: Self-pay | Admitting: Family Medicine

## 2018-11-06 ENCOUNTER — Encounter: Payer: Self-pay | Admitting: Family Medicine

## 2018-11-06 DIAGNOSIS — F0280 Dementia in other diseases classified elsewhere without behavioral disturbance: Secondary | ICD-10-CM

## 2018-11-06 DIAGNOSIS — G300 Alzheimer's disease with early onset: Secondary | ICD-10-CM

## 2018-11-06 DIAGNOSIS — F3289 Other specified depressive episodes: Secondary | ICD-10-CM

## 2018-11-06 DIAGNOSIS — F331 Major depressive disorder, recurrent, moderate: Secondary | ICD-10-CM

## 2018-11-06 MED ORDER — VENLAFAXINE HCL ER 75 MG PO CP24: ORAL_CAPSULE | ORAL | 0 refills | Status: DC

## 2018-11-06 MED ORDER — DONEPEZIL HCL 5 MG PO TABS: 5.0000 mg | ORAL_TABLET | Freq: Every day | ORAL | 0 refills | Status: DC

## 2018-11-06 NOTE — Assessment & Plan Note (Signed)
Concerns for concomitant depression.  On donepezil. - Refill donepezil 5 mg nightly - See plan for depression

## 2018-11-06 NOTE — Patient Instructions (Signed)
Thank you for coming in to see [place] today. Please see below to review our plan for today's visit.  Continue taking your donepezil and venlafaxine. I will have our social worker reach out to you guys to talk about your forgetfulness. Your frustration is normal. We will help you through this difficult process.   Please call the clinic at [phone] if your symptoms worsen or you have any concerns. It was our pleasure to serve you.  Harriet Butte, [place], PGY-3

## 2018-11-06 NOTE — Progress Notes (Signed)
   Subjective   Patient ID: Nancy Gilbert    DOB: 1960/10/28, 58 y.o. female   MRN: [id number]  CC: "Medication refills"  HPI: Nancy Gilbert is a 58 y.o. female who presents to clinic today for the following:  Dementia: Ms. Henrietta Gilbert is presenting today for a refill of her donepezil. She has been taking this since her diagnosis of early onset Alzheimer dementia. Her daughter who accompanies her helps administer the medcation using a pillbox.  Patient is unsure if this medication is helping.  She continues to have frequent forgetfulness and has become very frustrated with this.  Depression: Patient says she has been having episodes of crying but is unsure why. She denies feelings of sadness or anhedonia. She has been taking her Venlafaxine but is unsure if it helping. Her daughter says she feels it has helped but her mother had not taken the medication in the last 2 days. She is here today for a refill. Patient denies SI/HI or visual or auditory hallucinations.  She does feel safe at home and currently lives with her husband, daughter, son-in-law, and 2 grandchildren.  ROS: see HPI for pertinent.  [place]: Reviewed. Smoking status reviewed. Medications reviewed.  Objective   BP 104/66   Pulse 75   SpO2 98%  Vitals and nursing note reviewed.  General: well nourished, well developed, NAD with non-toxic appearance HEENT: normocephalic, atraumatic, moist mucous membranes Cardiovascular: regular rate and rhythm without murmurs, rubs, or gallops Lungs: clear to auscultation bilaterally with normal work of breathing Skin: warm, dry, no rashes or lesions, cap refill < 2 seconds Extremities: warm and well perfused, normal tone, no edema Neuro: grossly intact throughout, no dysarthria or facial droop Psych: dysthymic mood, flat affect  Assessment & Plan   Alzheimer's dementia ([place]) Concerns for concomitant depression.  On donepezil. - Refill donepezil 5 mg nightly - See  plan for depression  MDD (major depressive disorder) Likely related to Alzheimer's dementia.  Currently on venlafaxine.  Unsure if this is helping given PHQ 9 score of 16, very difficult compared to score of 11 back when dementia was diagnosed in November 2019. Discussed talking with social work about coping with forgetfulness with both patient and daughter.  Both are interested in having follow-up via telehealth.  No red flags. - Refill venlafaxine XR 75 mg twice daily - Will forward to [employer] with social work - RTC 2 weeks via telehealth  No orders of the defined types were placed in this encounter.  Meds ordered this encounter  Medications  . donepezil (ARICEPT) 5 MG tablet    Sig: Take 1 tablet (5 mg total) by mouth at bedtime.    Dispense:  90 tablet    Refill:  0  . venlafaxine XR (EFFEXOR-XR) 75 MG 24 hr capsule    Sig: TAKE 1 CAPSULE BY MOUTH ONCE DAILY WITH BREAKFAST AFTER  4  WEEKS  INCREASE  TO  2  CAPSULES  (150MG )  ONCE  DAILY    Dispense:  180 capsule    Refill:  0    Harriet Butte, DO [place], PGY-3 11/06/2018, 2:17 PM

## 2018-11-06 NOTE — Assessment & Plan Note (Addendum)
Likely related to Alzheimer's dementia.  Currently on venlafaxine.  Unsure if this is helping given PHQ 9 score of 16, very difficult compared to score of 11 back when dementia was diagnosed in November 2019. Discussed talking with social work about coping with forgetfulness with both patient and daughter.  Both are interested in having follow-up via telehealth.  No red flags. - Refill venlafaxine XR 75 mg twice daily - Will forward to [employer] with social work - RTC 2 weeks via telehealth

## 2018-11-13 ENCOUNTER — Telehealth: Payer: Self-pay | Admitting: Licensed Clinical Social Worker

## 2018-11-13 NOTE — Telephone Encounter (Signed)
   11/13/2018 Name: Nancy Gilbert MRN: [id number] DOB: April 08, 1961  Referred by: Dr. Yisroel Ramming  Reason for referral : Other (forgetfulness)  Nancy Gilbert is a 58 y.o. year old female who is a primary care patient of Nuala Alpha, DO. LCSW was consulted for assistance with forgetfulness.  Called daughter to assess needs. Demographics verified.  Per daughter patient missed 2 doses of her medication and she believes this contributed to concerns with the way she was feeling and increased frustration with forgetfulness.  She reports patient is doing much better and there are no concerns at this time. Daughter appreciative of phone call denied needing any resources or support from LCSW.       Intervention: Review of patient status, including review of consultants reports,with appropriate care team members was performed as part of comprehensive patient evaluation.   Plan: Daughter will F/U with PCP as advised and reach out to office if needed.   Dr. Yisroel Ramming has been informed of this outreach  Casimer Lanius, [place]   [phone] 10:55 AM

## 2018-12-09 ENCOUNTER — Other Ambulatory Visit: Payer: Self-pay

## 2018-12-09 DIAGNOSIS — G44229 Chronic tension-type headache, not intractable: Secondary | ICD-10-CM

## 2018-12-09 DIAGNOSIS — G8929 Other chronic pain: Secondary | ICD-10-CM

## 2018-12-09 DIAGNOSIS — R079 Chest pain, unspecified: Secondary | ICD-10-CM

## 2018-12-09 DIAGNOSIS — I10 Essential (primary) hypertension: Secondary | ICD-10-CM

## 2018-12-09 MED ORDER — METOPROLOL SUCCINATE ER 100 MG PO TB24: 100.0000 mg | ORAL_TABLET | Freq: Every day | ORAL | 0 refills | Status: DC

## 2019-01-23 ENCOUNTER — Other Ambulatory Visit: Payer: Self-pay | Admitting: Family Medicine

## 2019-01-23 DIAGNOSIS — G8929 Other chronic pain: Secondary | ICD-10-CM

## 2019-01-23 DIAGNOSIS — G44229 Chronic tension-type headache, not intractable: Secondary | ICD-10-CM

## 2019-01-23 DIAGNOSIS — I10 Essential (primary) hypertension: Secondary | ICD-10-CM

## 2019-01-23 DIAGNOSIS — R079 Chest pain, unspecified: Secondary | ICD-10-CM

## 2019-02-21 ENCOUNTER — Other Ambulatory Visit: Payer: Self-pay | Admitting: Family Medicine

## 2019-02-21 DIAGNOSIS — I10 Essential (primary) hypertension: Secondary | ICD-10-CM

## 2019-02-21 DIAGNOSIS — R079 Chest pain, unspecified: Secondary | ICD-10-CM

## 2019-02-21 DIAGNOSIS — G8929 Other chronic pain: Secondary | ICD-10-CM

## 2019-02-21 DIAGNOSIS — G44229 Chronic tension-type headache, not intractable: Secondary | ICD-10-CM

## 2019-02-22 ENCOUNTER — Encounter: Payer: Self-pay | Admitting: Family Medicine

## 2019-02-24 ENCOUNTER — Other Ambulatory Visit: Payer: Self-pay

## 2019-02-24 DIAGNOSIS — G300 Alzheimer's disease with early onset: Secondary | ICD-10-CM

## 2019-02-24 DIAGNOSIS — F0280 Dementia in other diseases classified elsewhere without behavioral disturbance: Secondary | ICD-10-CM

## 2019-02-24 MED ORDER — DONEPEZIL HCL 5 MG PO TABS: 5.0000 mg | ORAL_TABLET | Freq: Every day | ORAL | 0 refills | Status: DC

## 2019-03-20 ENCOUNTER — Other Ambulatory Visit: Payer: Self-pay | Admitting: *Deleted

## 2019-03-20 DIAGNOSIS — F3289 Other specified depressive episodes: Secondary | ICD-10-CM

## 2019-03-20 MED ORDER — VENLAFAXINE HCL ER 75 MG PO CP24: ORAL_CAPSULE | ORAL | 0 refills | Status: DC

## 2019-03-23 ENCOUNTER — Other Ambulatory Visit: Payer: Self-pay

## 2019-03-23 ENCOUNTER — Telehealth: Payer: Self-pay | Admitting: *Deleted

## 2019-03-23 DIAGNOSIS — F3289 Other specified depressive episodes: Secondary | ICD-10-CM

## 2019-03-23 MED ORDER — VENLAFAXINE HCL ER 75 MG PO CP24: ORAL_CAPSULE | ORAL | 0 refills | Status: DC

## 2019-03-23 NOTE — Telephone Encounter (Signed)
Left a message with spanish interpretor (jennifer [phone]) for pt to call back and schedule an appt for her depression. Deseree Kennon Holter, CMA

## 2019-03-23 NOTE — Telephone Encounter (Signed)
-----   Message from Nuala Alpha, DO sent at 03/20/2019  1:34 PM EST ----- Regarding: Refills on medication Can we please let this patient know I have refilled her medications but I would like for her to schedule an appointment to be seen for her depression. She was supposed to do a telemedicine follow up but hasn't been seen in any capacity in almost 5 months. Thanks!  Tim

## 2019-04-22 ENCOUNTER — Other Ambulatory Visit: Payer: Self-pay | Admitting: Family Medicine

## 2019-04-22 DIAGNOSIS — I10 Essential (primary) hypertension: Secondary | ICD-10-CM

## 2019-04-22 DIAGNOSIS — G44229 Chronic tension-type headache, not intractable: Secondary | ICD-10-CM

## 2019-04-22 DIAGNOSIS — G8929 Other chronic pain: Secondary | ICD-10-CM

## 2019-04-22 NOTE — Telephone Encounter (Signed)
Attempted to call patient using [employer] ID # [phone].  There was no answer and no voicemail available.  If patient should happen to call back, she will need to schedule an appointment.  Ozella Almond, [place]

## 2019-04-28 ENCOUNTER — Other Ambulatory Visit: Payer: Self-pay | Admitting: *Deleted

## 2019-04-28 DIAGNOSIS — I10 Essential (primary) hypertension: Secondary | ICD-10-CM

## 2019-04-28 DIAGNOSIS — G8929 Other chronic pain: Secondary | ICD-10-CM

## 2019-04-28 DIAGNOSIS — G44229 Chronic tension-type headache, not intractable: Secondary | ICD-10-CM

## 2019-04-28 DIAGNOSIS — R079 Chest pain, unspecified: Secondary | ICD-10-CM

## 2019-04-28 MED ORDER — METOPROLOL SUCCINATE ER 100 MG PO TB24: ORAL_TABLET | ORAL | 0 refills | Status: DC

## 2019-04-29 ENCOUNTER — Telehealth: Payer: Self-pay | Admitting: *Deleted

## 2019-04-29 NOTE — Telephone Encounter (Signed)
Attempted to call pt using spanish interpreter [phone] sebastian) but no voicemail was set up. Will try again later. Deseree Kennon Holter, CMA

## 2019-04-29 NOTE — Telephone Encounter (Signed)
-----   Message from Nuala Alpha, DO sent at 04/22/2019  9:44 AM EST ----- Regarding: appointment needed Hey, can we call her and let her know I have refilled her medication as requested but it has been over a year since she has been seen and I would like her to come in for a checkin and for lab work. Thanks!

## 2019-05-28 ENCOUNTER — Other Ambulatory Visit: Payer: Self-pay | Admitting: Family Medicine

## 2019-05-28 DIAGNOSIS — I10 Essential (primary) hypertension: Secondary | ICD-10-CM

## 2019-07-03 ENCOUNTER — Other Ambulatory Visit: Payer: Self-pay | Admitting: Family Medicine

## 2019-07-03 DIAGNOSIS — R079 Chest pain, unspecified: Secondary | ICD-10-CM

## 2019-07-03 DIAGNOSIS — G8929 Other chronic pain: Secondary | ICD-10-CM

## 2019-07-03 DIAGNOSIS — I10 Essential (primary) hypertension: Secondary | ICD-10-CM

## 2019-07-03 DIAGNOSIS — G44229 Chronic tension-type headache, not intractable: Secondary | ICD-10-CM

## 2019-07-16 ENCOUNTER — Other Ambulatory Visit: Payer: Self-pay | Admitting: Family Medicine

## 2019-07-16 DIAGNOSIS — G44229 Chronic tension-type headache, not intractable: Secondary | ICD-10-CM

## 2019-07-16 DIAGNOSIS — F3289 Other specified depressive episodes: Secondary | ICD-10-CM

## 2019-07-16 DIAGNOSIS — I10 Essential (primary) hypertension: Secondary | ICD-10-CM

## 2019-07-16 DIAGNOSIS — G8929 Other chronic pain: Secondary | ICD-10-CM

## 2019-07-20 ENCOUNTER — Other Ambulatory Visit: Payer: Self-pay | Admitting: Family Medicine

## 2019-07-20 DIAGNOSIS — I10 Essential (primary) hypertension: Secondary | ICD-10-CM

## 2019-07-20 DIAGNOSIS — G44229 Chronic tension-type headache, not intractable: Secondary | ICD-10-CM

## 2019-07-20 DIAGNOSIS — R079 Chest pain, unspecified: Secondary | ICD-10-CM

## 2019-07-20 DIAGNOSIS — G8929 Other chronic pain: Secondary | ICD-10-CM

## 2019-08-10 ENCOUNTER — Other Ambulatory Visit: Payer: Self-pay

## 2019-08-10 ENCOUNTER — Other Ambulatory Visit: Payer: Self-pay | Admitting: *Deleted

## 2019-08-10 ENCOUNTER — Ambulatory Visit (INDEPENDENT_AMBULATORY_CARE_PROVIDER_SITE_OTHER): Payer: 59 | Admitting: Family Medicine

## 2019-08-10 ENCOUNTER — Other Ambulatory Visit (HOSPITAL_COMMUNITY)
Admission: RE | Admit: 2019-08-10 | Discharge: 2019-08-10 | Disposition: A | Discharge status: Home / Self Care | Payer: 59 | Source: Ambulatory Visit | Attending: Family Medicine | Admitting: Family Medicine

## 2019-08-10 VITALS — BP 115/70 | HR 75 | Ht 63.5 in | Wt 165.6 lb

## 2019-08-10 DIAGNOSIS — Z124 Encounter for screening for malignant neoplasm of cervix: Secondary | ICD-10-CM | POA: Diagnosis present

## 2019-08-10 DIAGNOSIS — F3289 Other specified depressive episodes: Secondary | ICD-10-CM

## 2019-08-10 DIAGNOSIS — F0280 Dementia in other diseases classified elsewhere without behavioral disturbance: Secondary | ICD-10-CM

## 2019-08-10 DIAGNOSIS — E785 Hyperlipidemia, unspecified: Secondary | ICD-10-CM

## 2019-08-10 DIAGNOSIS — Z8619 Personal history of other infectious and parasitic diseases: Secondary | ICD-10-CM

## 2019-08-10 DIAGNOSIS — J029 Acute pharyngitis, unspecified: Secondary | ICD-10-CM

## 2019-08-10 DIAGNOSIS — G8929 Other chronic pain: Secondary | ICD-10-CM

## 2019-08-10 DIAGNOSIS — R079 Chest pain, unspecified: Secondary | ICD-10-CM

## 2019-08-10 DIAGNOSIS — G300 Alzheimer's disease with early onset: Secondary | ICD-10-CM

## 2019-08-10 DIAGNOSIS — I10 Essential (primary) hypertension: Secondary | ICD-10-CM

## 2019-08-10 DIAGNOSIS — G44229 Chronic tension-type headache, not intractable: Secondary | ICD-10-CM

## 2019-08-10 MED ORDER — VENLAFAXINE HCL ER 75 MG PO CP24: ORAL_CAPSULE | ORAL | 3 refills | Status: DC

## 2019-08-10 MED ORDER — ATORVASTATIN CALCIUM 40 MG PO TABS: 40.0000 mg | ORAL_TABLET | Freq: Every day | ORAL | 3 refills | Status: DC

## 2019-08-10 MED ORDER — FLUTICASONE PROPIONATE 50 MCG/ACT NA SUSP: 2.0000 | Freq: Every day | NASAL | 6 refills | Status: DC

## 2019-08-10 MED ORDER — DONEPEZIL HCL 5 MG PO TABS: 5.0000 mg | ORAL_TABLET | Freq: Every day | ORAL | 3 refills | Status: DC

## 2019-08-10 MED ORDER — METOPROLOL SUCCINATE ER 100 MG PO TB24: ORAL_TABLET | ORAL | 3 refills | Status: DC

## 2019-08-10 MED ORDER — ENALAPRIL-HYDROCHLOROTHIAZIDE 10-25 MG PO TABS: 1.0000 | ORAL_TABLET | Freq: Every day | ORAL | 3 refills | Status: DC

## 2019-08-10 NOTE — Patient Instructions (Signed)
Prueba de Papanicolaou Pap Test Por qu me debo realizar esta prueba? La prueba de Papanicolaou, tambin denominada citologa vaginal, es una prueba de cribado para detectar signos de:  Cncer de la vagina, del cuello uterino y del tero. El cuello uterino es la parte baja del tero que se abre hacia la vagina.  Infeccin.  Cambios que podran ser un signo de que se est desarrollando un cncer (cambios precancerosos). Las mujeres deben realizarse esta prueba con regularidad. En general, debe hacerse una prueba de Papanicolaou cada 3 aos hasta alcanzar la menopausia o hasta los 65 aos. Las mujeres entre 30 y 60 aos de edad pueden elegir realizarse la prueba de Papanicolaou al mismo tiempo que la prueba del VPH (virus del papiloma humano) cada 5 aos (en lugar de cada 3 aos). El mdico puede recomendarle que se realice pruebas de Papanicolaou con ms o menos frecuencia en funcin de sus afecciones mdicas y los resultados de la prueba de Papanicolaou anterior. Qu tipo de muestra se toma?  El mdico recolectar una muestra de clulas de la superficie del cuello uterino. Lo har utilizando un pequeo hisopo de algodn, una esptula de plstico o un cepillo. Esta muestra se recolecta durante un examen plvico, mientras usted est recostada boca arriba sobre la mesa de examen con los pies en los descansos para pies (estribos). En algunos casos, tambin pueden recolectarse fluidos (secreciones) del cuello uterino y la vagina. Cmo debo prepararme para esta prueba?  Tenga en cuenta en qu etapa del ciclo menstrual se encuentra. Es posible que se le pida que vuelva a programar la prueba si est menstruando el da en que debe realizrsela.  Si el da en que debe realizarse la prueba tiene una infeccin vaginal aparente, deber volver a programar la prueba.  Siga las indicaciones del mdico acerca de lo siguiente: ? Cambiar o suspender los medicamentos que toma habitualmente. Algunos  medicamentos pueden provocar resultados anormales de la prueba, como los digitlicos y la tetraciclina. ? Evite las duchas vaginales o los baos de inmersin el da de la prueba o el da anterior. Informe al mdico acerca de lo siguiente:  Cualquier alergia que tenga.  Todos los medicamentos que utiliza, incluidos vitaminas, hierbas, gotas oftlmicas, cremas y medicamentos de venta libre.  Cualquier enfermedad de la sangre que tenga.  Cirugas a las que se someti.  Cualquier afeccin mdica que tenga.  Si est embarazada o podra estarlo. Cmo se informan los resultados? Los resultados de la prueba se informarn como anormales o normales. Puede producirse un resultado positivo falso. Este tipo de resultado es incorrecto porque indica que una enfermedad est presente cuando en realidad no lo est. Puede producirse un resultado negativo falso. Este tipo de resultado es incorrecto porque indica que una enfermedad no est presente cuando en realidad lo est. Qu significan los resultados? Un resultado normal en la prueba significa que no tiene signos de cncer de la vagina, del cuello uterino o del tero. Un resultado anormal puede significar que tiene:  Cncer. Una prueba de Papanicolaou por s sola no es suficiente para diagnosticar cncer. En este caso, se le realizarn ms pruebas.  Cambios precancerosos en la vagina, cuello uterino o tero.  Inflamacin del cuello uterino.  Enfermedades de transmisin sexual (ETS).  Infecciones por hongos.  Infecciones por parsitos. Hable con su mdico sobre lo que significan sus resultados. Preguntas para hacerle al mdico Consulte a su mdico o pregunte en el departamento donde se realiza la prueba acerca de lo siguiente:    Cundo estarn disponibles mis resultados?  Cmo obtendr mis resultados?  Cules son mis opciones de tratamiento?  Qu otras pruebas necesito?  Cules son los prximos pasos que debo seguir? Resumen  En  general, las mujeres deben hacerse una prueba de Papanicolaou cada 3 aos hasta alcanzar la menopausia o hasta los 65 aos de edad.  El mdico recolectar una muestra de clulas de la superficie del cuello uterino. Lo har utilizando un pequeo hisopo de algodn, una esptula de plstico o un cepillo.  En algunos casos, tambin pueden recolectarse fluidos (secreciones) del cuello uterino y la vagina. Esta informacin no tiene como fin reemplazar el consejo del mdico. Asegrese de hacerle al mdico cualquier pregunta que tenga. Document Revised: 04/09/2017 Document Reviewed: 04/09/2017 Elsevier Patient Education  2020 Elsevier Inc.  

## 2019-08-10 NOTE — Progress Notes (Signed)
    SUBJECTIVE:   CHIEF COMPLAINT / HPI:   Request for Pap Patient comes in today with her daughter. She states she has been in the [place] for the past year. While she was there she was told she had HPV and needed a surgery. She is not sure what was done but she has pictures and a path report. Unfortunately the path report is in [place]. She requests we check her for HPV today as her doctor in the [place] had said she would need to be rechecked. It appears from the pictures patient had a hysterectomy.  A Spanish interpreter was used for the duration of this visit.  PERTINENT  PMH / PSH: HTN, HLD, Alzheimer's dementia  OBJECTIVE:   BP 115/70   Pulse 75   Wt 165 lb 9.6 oz (75.1 kg)   SpO2 98%   BMI 27.56 kg/m   Physical Exam  Constitutional: She is well-developed, well-nourished, and in no distress. No distress.  Genitourinary:    Vulva and vagina normal.     No vulval lesion, rash or exudate noted.     No vaginal discharge.     Genitourinary Comments: Uterus and cervix absent.     ASSESSMENT/PLAN:   History of HPV infection Patient reports history of HPV but last two previous Paps performed at [facility] were negative. Patient appears to have had a [place] in [place] last year. - Repeat Pap today for HPV. If this is negative and then she would only need one more pap to rule out HPV and she would no longer need pap smears since she has had hysterectomy. This is all given that her reported history of positive HPV is accurate. I could not verify this in her chart.    Arlyce Harman, DO [facility]

## 2019-08-11 DIAGNOSIS — Z8619 Personal history of other infectious and parasitic diseases: Secondary | ICD-10-CM | POA: Insufficient documentation

## 2019-08-11 HISTORY — DX: Personal history of other infectious and parasitic diseases: Z86.19

## 2019-08-11 NOTE — Assessment & Plan Note (Signed)
Patient reports history of HPV but last two previous Paps performed at [facility] were negative. Patient appears to have had a hyste[place] in the [place] last year. - Repeat Pap today for HPV. If this is negative and then she would only need one more pap to rule out HPV and she would no longer need pap smears since she has had hysterectomy. This is all given that her reported history of positive HPV is accurate. I could not verify this in her chart.

## 2019-08-12 LAB — CYTOLOGY - PAP
Comment: NEGATIVE
Diagnosis: NEGATIVE
High risk HPV: NEGATIVE

## 2019-09-16 ENCOUNTER — Ambulatory Visit (HOSPITAL_COMMUNITY)
Admission: RE | Admit: 2019-09-16 | Discharge: 2019-09-16 | Disposition: A | Discharge status: Home / Self Care | Payer: 59 | Source: Ambulatory Visit | Attending: Family Medicine | Admitting: Family Medicine

## 2019-09-16 ENCOUNTER — Other Ambulatory Visit: Payer: Self-pay

## 2019-09-16 ENCOUNTER — Ambulatory Visit (INDEPENDENT_AMBULATORY_CARE_PROVIDER_SITE_OTHER): Payer: 59 | Admitting: Family Medicine

## 2019-09-16 ENCOUNTER — Encounter: Payer: Self-pay | Admitting: Family Medicine

## 2019-09-16 VITALS — BP 102/62 | HR 93 | Ht 63.5 in | Wt 166.4 lb

## 2019-09-16 DIAGNOSIS — M25561 Pain in right knee: Secondary | ICD-10-CM

## 2019-09-16 DIAGNOSIS — I10 Essential (primary) hypertension: Secondary | ICD-10-CM | POA: Diagnosis not present

## 2019-09-16 DIAGNOSIS — M75101 Unspecified rotator cuff tear or rupture of right shoulder, not specified as traumatic: Secondary | ICD-10-CM

## 2019-09-16 MED ORDER — LIDOCAINE 4 % EX PTCH: MEDICATED_PATCH | CUTANEOUS | 0 refills | Status: DC

## 2019-09-16 NOTE — Patient Instructions (Addendum)
Go to [facility] to get a picture of the right knee.   Follow up in 1 week with me.

## 2019-09-16 NOTE — Progress Notes (Signed)
    SUBJECTIVE:   CHIEF COMPLAINT / HPI:   [name] is a 59 yo F that presents with her daughter today to discuss the concerns below.  Right Knee Pain  Had a fall 1.5 months ago. Did not seek care. Pain is localized to the outside of the knee. Has been using menthol rub which gives temporary relief. Has some discomfort with walking. Denies pain radiation, weakness, numbness or tingling.  Right arm pain Intermittent for 2 years. The pain is deep in the shoulder and sometimes she cannot move it to get dressed. She denies loss of sensation and tingling.  HTN Patient is unsure of what medication she is taking for her blood pressure.    PERTINENT  PMH / PSH: Alzheimer's dementia, Right rotator cuff tear, MDD  OBJECTIVE:   BP 102/62   Pulse 93   Ht 5' 3.5" (1.613 m)   Wt 166 lb 6.4 oz (75.5 kg)   SpO2 98%   BMI 29.01 kg/m     General: Appears to have mild intermittent pain with gait. Age appropriate. Cardiac: RRR, normal heart sounds, no murmurs Respiratory: CTAB, normal effort Extremities: Rt. Shoulder w/ decrease range of motion. Right knee with mild edema. No erythema. Non tender. Antalgic gait  ASSESSMENT/PLAN:   Acute pain of right knee Due to recent fall. Nature of fall unknown at this time but suspect there could be a hypotension component. Knee imaging with early osteoarthritis and small joint effusion.  -Patient to follow up next week -Encouraged continued use of menthol rub.  -Consider adding NSAIDs with normal CMP  Tear of right rotator cuff Chronic pain from the above condition. Consider frozen shoulder syndrome with intermittent interference in ADLs. -Lidocaine patches as needed -Follow up as needed.  Primary hypertension BP 102/62. If pt is compliant with her medication she could have a hypotensive component. ASVD risk 4.8 (statin medication on list).  -Patient asked to bring all medications to next visit -Consider obtaining orthostatic vitals    -Consider decrease BP medication if patient taking -Follow up in 1 week.   Lavonda Jumbo, DO [facility]   *Spanish interpreter Nancy Gilbert #[phone] was present for the entire duration of the visit

## 2019-09-17 ENCOUNTER — Telehealth: Payer: Self-pay | Admitting: Family Medicine

## 2019-09-17 LAB — COMPREHENSIVE METABOLIC PANEL
ALT: 15 IU/L (ref 0–32)
AST: 18 IU/L (ref 0–40)
Albumin/Globulin Ratio: 1.8 (ref 1.2–2.2)
Albumin: 4.5 g/dL (ref 3.8–4.8)
Alkaline Phosphatase: 97 IU/L (ref 39–117)
BUN/Creatinine Ratio: 27 (ref 12–28)
BUN: 20 mg/dL (ref 8–27)
Bilirubin Total: <0.2 mg/dL (ref 0.0–1.2)
CO2: 29 mmol/L (ref 20–29)
Calcium: 10.3 mg/dL (ref 8.7–10.3)
Chloride: 101 mmol/L (ref 96–106)
Creatinine, Ser: 0.75 mg/dL (ref 0.57–1.00)
GFR calc Af Amer: 99 mL/min/{1.73_m2} (ref >59)
GFR calc non Af Amer: 86 mL/min/{1.73_m2} (ref >59)
Globulin, Total: 2.5 g/dL (ref 1.5–4.5)
Glucose: 101 mg/dL — ABNORMAL HIGH (ref 65–99)
Potassium: 3.7 mmol/L (ref 3.5–5.2)
Sodium: 143 mmol/L (ref 134–144)
Total Protein: 7 g/dL (ref 6.0–8.5)

## 2019-09-17 LAB — LIPID PANEL
Chol/HDL Ratio: 6.9 ratio — ABNORMAL HIGH (ref 0.0–4.4)
Cholesterol, Total: 255 mg/dL — ABNORMAL HIGH (ref 100–199)
HDL: 37 mg/dL — ABNORMAL LOW (ref >39)
LDL Chol Calc (NIH): 135 mg/dL — ABNORMAL HIGH (ref 0–99)
Triglycerides: 452 mg/dL — ABNORMAL HIGH (ref 0–149)
VLDL Cholesterol Cal: 83 mg/dL — ABNORMAL HIGH (ref 5–40)

## 2019-09-17 NOTE — Telephone Encounter (Signed)
Called patient using pacific interpreter line to inform patient about knee imaging results. Family member answered and asked to call back and hung up.  Dr. Salvadore Dom, DO

## 2019-09-23 DIAGNOSIS — M25561 Pain in right knee: Secondary | ICD-10-CM | POA: Insufficient documentation

## 2019-09-23 NOTE — Assessment & Plan Note (Signed)
Due to recent fall. Nature of fall unknown at this time but suspect there could be a hypotension component. Knee imaging with early osteoarthritis and small joint effusion.  -Patient to follow up next week -Encouraged continued use of menthol rub.  -Consider adding NSAIDs with normal CMP

## 2019-09-23 NOTE — Assessment & Plan Note (Signed)
Chronic pain from the above condition. Consider frozen shoulder syndrome with intermittent interference in ADLs. -Lidocaine patches as needed -Follow up as needed.

## 2019-09-23 NOTE — Assessment & Plan Note (Signed)
BP 102/62. If pt is compliant with her medication she could have a hypotensive component. ASVD risk 4.8 (statin medication on list).  -Patient asked to bring all medications to next visit -Consider obtaining orthostatic vitals  -Consider decrease BP medication if patient taking -Follow up in 1 week.

## 2019-09-24 ENCOUNTER — Encounter: Payer: Self-pay | Admitting: Family Medicine

## 2019-09-24 ENCOUNTER — Ambulatory Visit (INDEPENDENT_AMBULATORY_CARE_PROVIDER_SITE_OTHER): Payer: 59 | Admitting: Family Medicine

## 2019-09-24 ENCOUNTER — Other Ambulatory Visit: Payer: Self-pay

## 2019-09-24 ENCOUNTER — Ambulatory Visit: Payer: Self-pay | Admitting: Licensed Clinical Social Worker

## 2019-09-24 VITALS — BP 115/80 | HR 84 | Ht 62.76 in | Wt 166.2 lb

## 2019-09-24 DIAGNOSIS — I10 Essential (primary) hypertension: Secondary | ICD-10-CM | POA: Diagnosis not present

## 2019-09-24 DIAGNOSIS — G4700 Insomnia, unspecified: Secondary | ICD-10-CM

## 2019-09-24 DIAGNOSIS — F331 Major depressive disorder, recurrent, moderate: Secondary | ICD-10-CM | POA: Diagnosis not present

## 2019-09-24 DIAGNOSIS — G300 Alzheimer's disease with early onset: Secondary | ICD-10-CM

## 2019-09-24 DIAGNOSIS — R0600 Dyspnea, unspecified: Secondary | ICD-10-CM

## 2019-09-24 DIAGNOSIS — G8929 Other chronic pain: Secondary | ICD-10-CM

## 2019-09-24 DIAGNOSIS — W19XXXD Unspecified fall, subsequent encounter: Secondary | ICD-10-CM

## 2019-09-24 DIAGNOSIS — Y92009 Unspecified place in unspecified non-institutional (private) residence as the place of occurrence of the external cause: Secondary | ICD-10-CM

## 2019-09-24 DIAGNOSIS — M25561 Pain in right knee: Secondary | ICD-10-CM

## 2019-09-24 DIAGNOSIS — F0280 Dementia in other diseases classified elsewhere without behavioral disturbance: Secondary | ICD-10-CM

## 2019-09-24 DIAGNOSIS — R079 Chest pain, unspecified: Secondary | ICD-10-CM

## 2019-09-24 DIAGNOSIS — G44229 Chronic tension-type headache, not intractable: Secondary | ICD-10-CM

## 2019-09-24 MED ORDER — METOPROLOL SUCCINATE ER 50 MG PO TB24: 50.0000 mg | ORAL_TABLET | Freq: Every day | ORAL | 3 refills | Status: DC

## 2019-09-24 MED ORDER — VENTOLIN HFA 108 (90 BASE) MCG/ACT IN AERS: INHALATION_SPRAY | RESPIRATORY_TRACT | 0 refills | Status: DC

## 2019-09-24 MED ORDER — NAPROXEN 250 MG PO TABS: 250.0000 mg | ORAL_TABLET | Freq: Two times a day (BID) | ORAL | 0 refills | Status: DC

## 2019-09-24 MED ORDER — ASPIRIN-ACETAMINOPHEN-CAFFEINE 250-250-65 MG PO TABS: 2.0000 | ORAL_TABLET | Freq: Four times a day (QID) | ORAL | 2 refills | Status: DC | PRN

## 2019-09-24 MED ORDER — MELATONIN 1 MG PO CHEW: 1.0000 mg | CHEWABLE_TABLET | Freq: Every evening | ORAL | 0 refills | Status: DC

## 2019-09-24 NOTE — Patient Instructions (Signed)
You have received a referral to our Geriatric clinic. We will call you to make the appointment.  A psychiatry referral has been made.   The suicide hotline is 1-800-273-TALK  Your metoprolol has been reduced to 50 mg daily. Please call if you have any chest pain or headache. We would like to recheck this in 1 week.   You can take prescribed melatonin 1mg   Nightly for sleep.   You can use naproxen for the knee pain

## 2019-09-24 NOTE — Progress Notes (Signed)
Patient given PHQ2 and PHQ9.  Provider aware.  .Naomy Esham R Yared Susan, CMA  

## 2019-09-24 NOTE — Progress Notes (Signed)
    SUBJECTIVE:   *Spanish interpretation was used during the duration of this visit Irma [phone]  CHIEF COMPLAINT / HPI:   Mrs. jorja empie is presenting to the office today with her husband for a follow up visit. The concerns are addressed below.   Dementia with Behavioral disturbances The patient's husband is worried that she is getting worse. She burns the cooking because she forget things are on the stove. She also gets really angry and takes off all clothes and goes outside in the street. He states in his 101 years of marriage he has never seen her like this.   MDD Patients states she is depressed most day and feel she is better off dead. She denies suicidal or homicidal thoughts. Her husband states she cries a lot and does not sleep.  Right knee pain Continues to endorse right knee pain from a fall 1.5 months prior but the swelling has not increased. She feels sore all over and does not feel as if she has the strength to do anything. Husband endorses the patient has occasional dizziness and lightheadedness.  PERTINENT  PMH / PSH: HTN, headaches, HLD, Chronic insomnia  OBJECTIVE:   BP 115/80   Pulse 84   Ht 5' 2.76" (1.594 m)   Wt 166 lb 3.2 oz (75.4 kg)   SpO2 100%   BMI 29.67 kg/m    General: Appears well, no acute distress. Age appropriate. Antalgic gait Extremities: Right knee with mild lateral swelling w/o bruising. No LE edema. Psych: normal to agitated affect. Appears agitated when husband speaks of her dementia condition. Husband tearful when speaking about this.   Office Visit from 09/24/2019 in [facility]  PHQ-9 Total Score  26     RIGHT KNEE - COMPLETE 4+ VIEW. Completed 09/17/2019 COMPARISON:  None. FINDINGS: No acute bony abnormality. Specifically, no fracture, subluxation, or dislocation. Small joint effusion. Early joint space narrowing and spurring in the medial and patellofemoral compartments. IMPRESSION: No acute bony  abnormality. Early osteoarthritis and small joint effusion  ASSESSMENT/PLAN:   Alzheimer's dementia (HCC) Now with behavioral disturbances. Husband appears to be quite burdened by this and would like help navigating through this.  -Curbside with Sammuel Hines: Referral to psych and geri clinic -Alzheimer's Spanish handout given to husband   MDD (major depressive disorder) PHQ9 score of 26 w/ thought of wanting to be dead. Denies suicidal or homicidal thoughts. -Continue Effexor 75mg  daily  -Start 1 mg melatonin nightly; instructed to call if lose does does not help can increase. Would like to start low and go slow with this patient -Referral to psychiatry; BH curbside during visit -Suicide hotline given  Acute pain of right knee Stable. No fx from fall but small joint effusion. Short course of NSAIDs with strengthening. Consider PT if other conservative measure fail to improve symptoms.    Fall at home No fx. Etiology unknown. Patient is a poor historian d/t dementia. Fall possibly due to hypotension. Would error on the side of a higher blood pressure.  -metoprolol decreased this visit as below  Primary hypertension BP 115/80 and last visit was 102/62. Hypotension may be the etiology of the fall. Would favor higher BP given the symptoms. -Metoprolol decreased from 100mg  to 50mg   -Recheck BP at follow up visit -Precaution of high BP given    , DO [facility]

## 2019-09-24 NOTE — Chronic Care Management (AMB) (Signed)
   Social Work  Care Management Consultation  09/24/2019 Name: Sabena Winner MRN: [id number] DOB: Nov 20, 1960  [name] de Gretta Arab is a 59 y.o. year old female who sees Arlyce Harman, DO for primary care. LCSW was consulted by Dr. Salvadore Dom for information /resources to assistance patient's family with Dementia and Caregiver Support. Husband having difficulty with managing patient's behavior. per PCP, PHQ-9 positive with no plan to harm or kill self, thoughts of better off not here     Recommendation: After consultation with provider it is determined that patient may benefit from a psych referr and providing husband with educational information.  Provided booklet on "caring for a person with Alzheimer's Disease" in Spanish. Patient may also benefit from [facility] clinic F/U.  OBJECTIVE: Intervention: Patient was not interviewed or contacted during this encounter.  LCSW collaborated with Dr.Autry-Lott.  Relevant information and resources discussed and shared with provider. Provider will give information to patient and husband.    Review of patient status, including review of consultants reports, relevant laboratory and other test results, and collaboration with appropriate care team members and the patient's provider was performed as part of comprehensive patient evaluation and provision of chronic care management services.    Plan: provider will refer patient to [facility] for F/U LCSW will wait for referral or consultation from [facility] clinic   Sammuel Hines, LCSW Chronic Care Coordination  [facility] / [facility]   [phone] 1:49 PM

## 2019-09-25 DIAGNOSIS — W19XXXA Unspecified fall, initial encounter: Secondary | ICD-10-CM | POA: Insufficient documentation

## 2019-09-25 DIAGNOSIS — Y92009 Unspecified place in unspecified non-institutional (private) residence as the place of occurrence of the external cause: Secondary | ICD-10-CM | POA: Insufficient documentation

## 2019-09-25 NOTE — Assessment & Plan Note (Signed)
BP 115/80 and last visit was 102/62. Hypotension may be the etiology of the fall. Would favor higher BP given the symptoms. -Metoprolol decreased from 100mg  to 50mg   -Recheck BP at follow up visit -Precaution of high BP given

## 2019-09-25 NOTE — Assessment & Plan Note (Addendum)
PHQ9 score of 26 w/ thought of wanting to be dead. Denies suicidal or homicidal thoughts. -Continue Effexor 75mg  daily  -Start 1 mg melatonin nightly; instructed to call if lose does does not help can increase. Would like to start low and go slow with this patient -Referral to psychiatry; BH curbside during visit -Suicide hotline given

## 2019-09-25 NOTE — Assessment & Plan Note (Addendum)
No fx. Etiology unknown. Patient is a poor historian d/t dementia. Fall possibly due to hypotension. Would error on the side of a higher blood pressure.  -metoprolol decreased this visit as below

## 2019-09-25 NOTE — Assessment & Plan Note (Signed)
Now with behavioral disturbances. Husband appears to be quite burdened by this and would like help navigating through this.  -Curbside with Sammuel Hines: Referral to psych and geri clinic -Alzheimer's Spanish handout given to husband

## 2019-09-25 NOTE — Assessment & Plan Note (Signed)
Stable. No fx from fall but small joint effusion. Short course of NSAIDs with strengthening. Consider PT if other conservative measure fail to improve symptoms.

## 2019-09-30 ENCOUNTER — Telehealth: Payer: Self-pay

## 2019-09-30 NOTE — Telephone Encounter (Signed)
Frankie from [facility] calling for PT verbal orders as follows:  1 time(s) weekly for 1 week(s), then 2 time(s) weekly for 1 week(s), then 1 time (s) weekly for 3 weeks  You can leave verbal orders on confidential voicemail at [phone].   Veronda Prude, RN

## 2019-10-02 ENCOUNTER — Ambulatory Visit: Payer: Self-pay | Admitting: Licensed Clinical Social Worker

## 2019-10-02 NOTE — Chronic Care Management (AMB) (Signed)
   Social Work  Care Management Collaboration 10/02/2019 Name: Nancy Gilbert MRN: [id number] DOB: 17-Feb-1961 Nancy Gilbert is a 59 y.o. year old female who sees Arlyce Harman, DO for primary care. LCSW was consulted to assistance patient and family with support options.  Intervention: Patient was not interviewed or contacted during this encounter.  LCSW collaborated with [facility] referral coordinator for referral to pscy placed by provider.  Also consulted  With Geri coordinator to see if patient needs to be rescheduled for [facility] clinic  Review of patient status, including review of consultants reports, relevant laboratory and other test results, and collaboration with appropriate care team members and the patient's provider was performed as part of comprehensive patient evaluation and provision of chronic care management services.    Plan: Patient will be scheduled for [facility] clinic after she is seen by pscy.   Sammuel Hines, LCSW Chronic Care Coordination  [facility] Family Medicine / [facility]   [phone] 9:24 AM

## 2019-10-05 ENCOUNTER — Telehealth: Payer: Self-pay

## 2019-10-05 NOTE — Telephone Encounter (Signed)
Donita from [facility] calling for nursing verbal orders as follows:  2 time(s) weekly for 1 week(s), then 1 time(s) weekly for 3 week(s)  You can leave verbal orders on confidential voicemail at [phone]  Veronda Prude, RN

## 2019-10-16 ENCOUNTER — Telehealth: Payer: Self-pay

## 2019-10-16 NOTE — Telephone Encounter (Signed)
Verbal orders given for UA and Urince Cx with sensitivities.  HH will make visit at first date available on Monday, June 7th.

## 2019-10-16 NOTE — Telephone Encounter (Signed)
Maralyn Sago, [facility] Nurse, calls nurse line reporting UTI symptoms in patient. Patient reporting dysuria and pressure. Maralyn Sago denies fevers or low back pain. Maralyn Sago is to go back next week. She is requesting verbal orders for a UA and possible culture. Please call Sarah with any verbal orders.   [phone]

## 2019-10-21 ENCOUNTER — Ambulatory Visit: Payer: Self-pay | Admitting: Licensed Clinical Social Worker

## 2019-10-21 DIAGNOSIS — Z7189 Other specified counseling: Secondary | ICD-10-CM

## 2019-10-21 NOTE — Chronic Care Management (AMB) (Signed)
    Clinical Social Work  Care Management Outreach   10/21/2019 Name: Nancy Gilbert MRN: [id number] DOB: [birth year]  Nancy Gilbert is Nancy 59 y.o. year old female who is Nancy primary care patient of Arlyce Harman, DO .  The Care Management team was consulted for assistance with care coordination for further evaluation of patient 's behavioral concerns.  Provider placed referrals to  Psychiatry,  Neurology and [facility].   Interventions: LCSW reached out and collaborated with all agencies for an update on the status of the referrals.   Per Neurology they have not been able to reach patient and welcome her to call to schedule an appointment.  [facility] did not respond to the referral.. Explained they are primary care and not referral for specialty services. Patient would have to leave [facility] as primary care in order to establish care with them.  Psychiatry referral not processed    LCSW reached out to Nancy Gilbert today by phone to introduce self and assist with coordinating her speciality care .  The outreach was unsuccessful. Briefly spoke with son-in-law Nancy Gilbert phone message was left for the patient providing contact information and requesting Nancy return call.  Son stated he would have patient's daughter return my call.   Follow Up Plan: If no return call is received will call again in 5 to 7 days.  Review of patient status, including review of consultants reports, relevant laboratory and other test results, and collaboration with appropriate care team members and the patient's provider was performed as part of comprehensive patient evaluation and provision of care management services.    Sammuel Hines, LCSW Chronic Care Coordination  [facility] Family Medicine / [facility]   [phone] 4:13 PM

## 2019-10-30 ENCOUNTER — Encounter: Payer: Self-pay | Admitting: Neurology

## 2019-10-30 ENCOUNTER — Ambulatory Visit: Payer: 59 | Admitting: Licensed Clinical Social Worker

## 2019-10-30 DIAGNOSIS — Z7189 Other specified counseling: Secondary | ICD-10-CM

## 2019-10-30 NOTE — Chronic Care Management (AMB) (Signed)
Care Management   Clinical Social Work Follow Up   10/30/2019 Name: Nancy Gilbert MRN: [id number] DOB: 1961/03/27 Referred by: Arlyce Harman, DO  Reason for referral : Care Coordination (F/U)  Nancy Gilbert is a 59 y.o. year old female who is a primary care patient of Arlyce Harman, DO.  Reason for follow-up: assess for barriers and progress with connecting to specialty referrals placed by provider in March and May .   Assessment: Family continues to experience difficult with patient.  They have not been able to connect to specialty due to several barriers.  Plan:  1. Family will call [facility] to schedule and appointment  2.  LCSW will F/U in 1 to 2 weeks to assess for barriers  Advance Directive Status: . ; not addressed during this encounter.  SDOH (Social Determinants of Health) assessments performed: No needs identified   Goals Addressed            This Visit's Progress   . Connect with Speciality       CARE PLAN ENTRY (see longitudinal plan of care for additional care plan information)  Current Barriers:  . Patient with Dementia needs assistance with connecting to specialty for referrals placed by provider ( Neurology and psychiatry) . Acknowledges deficits and needs support, education and care coordination in order to meet this unmet need  . Several Calls from [facility] Neurology unable to reach patient or family member . Cognitive Deficits and language barriers Clinical Goal(s):  Over the next 90 days, patient will receive care and assessments from specialty referrals placed by provider  Interventions: . Spoke with patient's daughter to assessed needs and barriers to care . Collaborated with  Neurology ref. Scheduling appointment (appointment scheduled Sept 2021 . Collaborated with [facility] ref. Barriers with referrals  . Reached out to [facility] psych ref. Referral.  Dr. Donell Beers not seeing new patient with dementia at [facility]  only at his practice. . Provided contact information and reviewed process of calling to make appointment with patient's daughter and grandson for [facility] and [facility] Patient Self Care Activities: . Patient is unable to independently navigate getting appointment without care coordination support  . Daughter and son will call to schedule appointments Initial goal documentation         Outpatient Encounter Medications as of 10/30/2019  Medication Sig  . acetaminophen (TYLENOL) 500 MG tablet Take 1 tablet (500 mg total) by mouth every 6 (six) hours as needed.  Marland Kitchen aspirin-acetaminophen-caffeine (EXCEDRIN MIGRAINE) 250-250-65 MG tablet Take 2 tablets by mouth every 6 (six) hours as needed for headache.  Marland Kitchen atorvastatin (LIPITOR) 40 MG tablet Take 1 tablet (40 mg total) by mouth daily.  Marland Kitchen donepezil (ARICEPT) 5 MG tablet Take 1 tablet (5 mg total) by mouth at bedtime.  . enalapril-hydrochlorothiazide (VASERETIC) 10-25 MG tablet Take 1 tablet by mouth daily.  . fluticasone (FLONASE) 50 MCG/ACT nasal spray Place 2 sprays into both nostrils daily.  Marland Kitchen gabapentin (NEURONTIN) 300 MG capsule Take 1 capsule (300 mg total) by mouth 3 (three) times daily.  . Lidocaine (HM LIDOCAINE PATCH) 4 % PTCH Apply 1 patch daily as needed for pain relief  . Melatonin 1 MG CHEW Chew 1 mg by mouth at bedtime.  . metoprolol succinate (TOPROL-XL) 50 MG 24 hr tablet Take 1 tablet (50 mg total) by mouth at bedtime. Take with or immediately following a meal.  . naproxen (NAPROSYN) 250 MG tablet Take 1 tablet (250 mg  total) by mouth 2 (two) times daily with a meal.  . venlafaxine XR (EFFEXOR-XR) 75 MG 24 hr capsule TAKE ONE CAPSULE BY MOUTH ONCE DAILY WITH BREAKFAST AFTER 4 WEEKS INCREASE TO 2 CAPSULES ONCE DAILY.  . VENTOLIN HFA 108 (90 Base) MCG/ACT inhaler INHALE 2 PUFFS BY MOUTH EVERY 6 HOURS AS NEEDED FOR WHEEZING FOR SHORTNESS OF BREATH   No facility-administered encounter medications on  file as of 10/30/2019.    Review of patient status, including review of consultants reports, relevant laboratory and other test results, and collaboration with appropriate care team members and the patient's provider was performed as part of comprehensive patient evaluation and provision of care management services.  Casimer Lanius, [place] / [place]   [phone] 11:06 AM

## 2019-11-05 ENCOUNTER — Ambulatory Visit: Payer: 59 | Admitting: Licensed Clinical Social Worker

## 2019-11-05 DIAGNOSIS — Z7189 Other specified counseling: Secondary | ICD-10-CM

## 2019-11-05 NOTE — Chronic Care Management (AMB) (Signed)
Care Management   Clinical Social Work Follow Up   11/05/2019 Name: Nancy Gilbert MRN: [id number] DOB: 02/08/1961 Referred by: Arlyce Harman, DO  Reason for referral : Care Coordination  Nancy Gilbert is a 59 y.o. year old female who is a primary care patient of Arlyce Harman, DO.  Reason for follow-up: assess for barriers and progress with connecting with psychiatry.  See care plan below with details for barriers and progress. Plan; LCSW will F/U in one week to assess for barriers Advance Directive Status:  not addressed during this encounter.  SDOH (Social Determinants of Health) assessments performed ; No needs identified   Goals Addressed            This Visit's Progress   . Connect with Speciality   Not on track    CARE PLAN ENTRY (see longitudinal plan of care for additional care plan information)  Current Barriers & progress:  . Patient with Dementia needs assistance with connecting to specialty for referrals placed by provider ( Neurology and psychiatry) . Acknowledges deficits and needs support, education and care coordination in order to meet this unmet need  . Cognitive Deficits and language barriers . [facility] ref. Scheduling appointment (appointment scheduled Sept 2021 . Patient's daughter called [facility], they do not take patient's insurance  Clinical Goal(s):  Over the next 90 days, patient will receive care and assessments from specialty referrals placed by provider  Interventions: . Spoke with patient's daughter to assessed needs and barriers to care . Informed daughter to call patient's insurance provider to identify who is in-network with patient's insurance . Patient Self Care Activities: . Patient is unable to independently navigate getting appointment without care coordination support  . Daughter and grandson will call insurance provider then call psychiatry to schedule an appointment Please  see past updates related to this goal by clicking on the "Past Updates" button in the selected goal          Outpatient Encounter Medications as of 11/05/2019  Medication Sig  . acetaminophen (TYLENOL) 500 MG tablet Take 1 tablet (500 mg total) by mouth every 6 (six) hours as needed.  Marland Kitchen aspirin-acetaminophen-caffeine (EXCEDRIN MIGRAINE) 250-250-65 MG tablet Take 2 tablets by mouth every 6 (six) hours as needed for headache.  Marland Kitchen atorvastatin (LIPITOR) 40 MG tablet Take 1 tablet (40 mg total) by mouth daily.  Marland Kitchen donepezil (ARICEPT) 5 MG tablet Take 1 tablet (5 mg total) by mouth at bedtime.  . enalapril-hydrochlorothiazide (VASERETIC) 10-25 MG tablet Take 1 tablet by mouth daily.  . fluticasone (FLONASE) 50 MCG/ACT nasal spray Place 2 sprays into both nostrils daily.  Marland Kitchen gabapentin (NEURONTIN) 300 MG capsule Take 1 capsule (300 mg total) by mouth 3 (three) times daily.  . Lidocaine (HM LIDOCAINE PATCH) 4 % PTCH Apply 1 patch daily as needed for pain relief  . Melatonin 1 MG CHEW Chew 1 mg by mouth at bedtime.  . metoprolol succinate (TOPROL-XL) 50 MG 24 hr tablet Take 1 tablet (50 mg total) by mouth at bedtime. Take with or immediately following a meal.  . naproxen (NAPROSYN) 250 MG tablet Take 1 tablet (250 mg total) by mouth 2 (two) times daily with a meal.  . venlafaxine XR (EFFEXOR-XR) 75 MG 24 hr capsule TAKE ONE CAPSULE BY MOUTH ONCE DAILY WITH BREAKFAST AFTER 4 WEEKS INCREASE TO 2 CAPSULES ONCE DAILY.  . VENTOLIN HFA 108 (90 Base) MCG/ACT inhaler INHALE 2 PUFFS BY MOUTH EVERY 6 HOURS AS  NEEDED FOR WHEEZING FOR SHORTNESS OF BREATH   No facility-administered encounter medications on file as of 11/05/2019.   Review of patient status, including review of consultants reports, relevant laboratory and other test results, and collaboration with appropriate care team members and the patient's provider was performed as part of comprehensive patient evaluation and provision of care management services.      Casimer Lanius, [place] / [place]   [phone] 9:23 AM

## 2019-11-06 ENCOUNTER — Telehealth: Payer: 59

## 2019-11-13 ENCOUNTER — Ambulatory Visit: Payer: Self-pay | Admitting: Licensed Clinical Social Worker

## 2019-11-13 ENCOUNTER — Telehealth: Payer: 59

## 2019-11-13 ENCOUNTER — Other Ambulatory Visit: Payer: Self-pay

## 2019-11-13 NOTE — Chronic Care Management (AMB) (Signed)
    Clinical Social Work  Care Management Outreach   11/13/2019 Name: Adrinne Sze MRN: [id number] DOB: [birth year]  [name] de Gretta Arab is a 59 y.o. year old female who is a primary care patient of Maury Dus, MD .  The Care Management team was consulted for assistance with connecting to psychiatry.   LCSW reached out to [name] de [place] daughter Byrd Hesselbach today to assess barriers with connecting to psychiatry.  The outreach was unsuccessful. A HIPPA compliant phone message was left for the patient providing contact information and requesting a return call.   Plan: if not return call again in 2 weeks  Review of patient status, including review of consultants reports, relevant laboratory and other test results, and collaboration with appropriate care team members and the patient's provider was performed as part of comprehensive patient evaluation and provision of care management services.    Sammuel Hines, LCSW Care Management & Coordination  [facility] Family Medicine / [facility]   [phone] 3:12 PM

## 2019-11-20 ENCOUNTER — Other Ambulatory Visit: Payer: Self-pay | Admitting: Family Medicine

## 2019-11-20 DIAGNOSIS — R0600 Dyspnea, unspecified: Secondary | ICD-10-CM

## 2019-11-26 ENCOUNTER — Telehealth: Payer: Self-pay

## 2019-11-26 NOTE — Telephone Encounter (Signed)
Nancy Gilbert, [facility] nursing, calls nurse line requesting VO to continuing home health skilled nursing as follows:  1x a week for 4 weeks.  Verbal orders given per [facility] protocol.

## 2019-12-02 ENCOUNTER — Telehealth: Payer: Self-pay

## 2019-12-02 NOTE — Telephone Encounter (Signed)
Donita, HH Nurse, calls nurse line to report a fall. Donita reports patient was outside alone and "fell over" in her yard. Donita states the patient reported arm and leg pain to her, however nothing severe. Fall happened yesterday. Donita informed patient and caregiver to not go or let go outside alone. If patients pain persists she will need to be seen in clinic.

## 2019-12-03 ENCOUNTER — Ambulatory Visit: Payer: 59 | Admitting: Licensed Clinical Social Worker

## 2019-12-03 DIAGNOSIS — Z7189 Other specified counseling: Secondary | ICD-10-CM

## 2019-12-03 NOTE — Chronic Care Management (AMB) (Signed)
Care Management   Clinical Social Work Follow Up   12/03/2019 Name: Nancy Gilbert MRN: [id number] DOB: 01-Nov-1960 Referred by: Maury Dus, MD  Reason for referral : Care Coordination (F/U)  Nancy Gilbert is a 59 y.o. year old female who is a primary care patient of Maury Dus, MD.  Reason for follow-up: assess for barriers and progress with psychiatry .   Assessment: Patient's family continues to experience barriers with connecting to psychiatry due to patient's insurance Plan:  1. Family will call insurance provider to locate in-network provider  2.  LCSW will F/U in two weeks Advance Directive Status:  ; not addressed during this encounter.  SDOH (Social Determinants of Health) assessments performed: No needs identified   Goals Addressed            This Visit's Progress   . Connect with Speciality   Not on track    CARE PLAN ENTRY (see longitudinal plan of care for additional care plan information)  Current Barriers & progress:  . Patient with Dementia needs assistance with connecting to specialty for referrals placed by provider ( Neurology and psychiatry) . Acknowledges deficits and needs support, education and care coordination in order to meet this unmet need  . Cognitive Deficits and language barriers . [facility] ref. Scheduling appointment (appointment scheduled Sept 2021 . Patient's daughter called [facility], they do not take patient's insurance  . Family still has not been able to locate a psychiatrist that takes patient's insurance. Reports they have called several places Clinical Goal(s):  Over the next 90 days, patient will receive care and assessments from specialty referrals placed by provider  Interventions: . Spoke with patient's grandson [name] to assessed needs and barriers with connecting to psychiatry . Informed family to again call patient's insurance provider to identify who is in-network  with patient's insurance . Patient Self Care Activities: . Patient is unable to independently navigate getting appointment without care coordination support  . Daughter and grandson will call insurance provider then call psychiatry to schedule an appointment Please see past updates related to this goal by clicking on the "Past Updates" button in the selected goal         Outpatient Encounter Medications as of 12/03/2019  Medication Sig  . acetaminophen (TYLENOL) 500 MG tablet Take 1 tablet (500 mg total) by mouth every 6 (six) hours as needed.  Marland Kitchen aspirin-acetaminophen-caffeine (EXCEDRIN MIGRAINE) 250-250-65 MG tablet Take 2 tablets by mouth every 6 (six) hours as needed for headache.  Marland Kitchen atorvastatin (LIPITOR) 40 MG tablet Take 1 tablet (40 mg total) by mouth daily.  Marland Kitchen donepezil (ARICEPT) 5 MG tablet Take 1 tablet (5 mg total) by mouth at bedtime.  . enalapril-hydrochlorothiazide (VASERETIC) 10-25 MG tablet Take 1 tablet by mouth daily.  . fluticasone (FLONASE) 50 MCG/ACT nasal spray Place 2 sprays into both nostrils daily.  Marland Kitchen gabapentin (NEURONTIN) 300 MG capsule Take 1 capsule (300 mg total) by mouth 3 (three) times daily.  . Lidocaine (HM LIDOCAINE PATCH) 4 % PTCH Apply 1 patch daily as needed for pain relief  . Melatonin 1 MG CHEW Chew 1 mg by mouth at bedtime.  . metoprolol succinate (TOPROL-XL) 50 MG 24 hr tablet Take 1 tablet (50 mg total) by mouth at bedtime. Take with or immediately following a meal.  . naproxen (NAPROSYN) 250 MG tablet Take 1 tablet (250 mg total) by mouth 2 (two) times daily with a meal.  . venlafaxine XR (EFFEXOR-XR) 75  MG 24 hr capsule TAKE ONE CAPSULE BY MOUTH ONCE DAILY WITH BREAKFAST AFTER 4 WEEKS INCREASE TO 2 CAPSULES ONCE DAILY.  . VENTOLIN HFA 108 (90 Base) MCG/ACT inhaler INHALE 2 PUFFS BY MOUTH EVERY 6 HOURS AS NEEDED FOR WHEEZING FOR SHORTNESS OF BREATH   No facility-administered encounter medications on file as of 12/03/2019.   Review of patient status,  including review of consultants reports, relevant laboratory and other test results, and collaboration with appropriate care team members and the patient's provider was performed as part of comprehensive patient evaluation and provision of care management services.    Sammuel Hines, LCSW Care Management & Coordination  [facility] Family Medicine / [facility]   [phone] 9:57 AM

## 2019-12-22 ENCOUNTER — Emergency Department (HOSPITAL_COMMUNITY)
Admission: EM | Admit: 2019-12-22 | Discharge: 2019-12-23 | Disposition: A | Discharge status: Home / Self Care | Payer: 59 | Attending: Emergency Medicine | Admitting: Emergency Medicine

## 2019-12-22 ENCOUNTER — Other Ambulatory Visit: Payer: Self-pay

## 2019-12-22 ENCOUNTER — Ambulatory Visit: Payer: 59 | Admitting: Licensed Clinical Social Worker

## 2019-12-22 DIAGNOSIS — R0981 Nasal congestion: Secondary | ICD-10-CM | POA: Diagnosis not present

## 2019-12-22 DIAGNOSIS — R079 Chest pain, unspecified: Secondary | ICD-10-CM | POA: Insufficient documentation

## 2019-12-22 DIAGNOSIS — R519 Headache, unspecified: Secondary | ICD-10-CM | POA: Insufficient documentation

## 2019-12-22 DIAGNOSIS — Z5321 Procedure and treatment not carried out due to patient leaving prior to being seen by health care provider: Secondary | ICD-10-CM | POA: Diagnosis not present

## 2019-12-22 DIAGNOSIS — R0602 Shortness of breath: Secondary | ICD-10-CM | POA: Insufficient documentation

## 2019-12-22 DIAGNOSIS — Z7189 Other specified counseling: Secondary | ICD-10-CM

## 2019-12-22 DIAGNOSIS — R509 Fever, unspecified: Secondary | ICD-10-CM | POA: Insufficient documentation

## 2019-12-22 NOTE — Chronic Care Management (AMB) (Addendum)
Care Management   Clinical Social Work Follow Up   12/22/2019 Name: Nancy Gilbert MRN: [id number] DOB: 1960-08-07 Referred by: Maury Dus, MD  Reason for referral : Care Coordination (F/U)  Nancy Gilbert de Gretta Arab is a 59 y.o. year old female who is a primary care patient of Maury Dus, MD.  Reason for follow-up: assess for barriers and progress with connecting to psychiatry .   Assessment: Patient;s daughter continues to experience barriers with connecting to psychiatry due to insurance.   Per daughter patient is going out of the [place] and has decided to wait until she returns before they schedule the appointment. Will contact [facility] when ready to make appointment.  Per daughter this is the phone number and agency recommended by insurance provider.   Plan:  1. Daughter will call LCSW as needed.   2. No F/U required by LCSW at this time.  Advance Directive Status:  ; not addressed during this encounter.  SDOH (Social Determinants of Health) assessments performed: No ; No needs identified   Goals Addressed            This Visit's Progress   . Connect with Speciality   Not on track    CARE PLAN ENTRY (see longitudinal plan of care for additional care plan information)  Current Barriers & progress:  . Patient with Dementia needs assistance with connecting to specialty for referrals placed by provider ( Neurology and psychiatry) . Acknowledges deficits and needs support, education and care coordination in order to meet this unmet need  . Cognitive Deficits and language barriers . [facility] ref. Scheduling appointment (appointment scheduled Sept 2021 . Patient's daughter called [facility], they do not take patient's insurance  . Family still has not been able to locate a psychiatrist that takes patient's insurance. Reports they have called several places . Per daughter she received phone numbers from  insurance provide  but still has not been able to connect Clinical Goal(s):  Over the next 90 days, patient will receive care and assessments from specialty referrals placed by provider  Interventions: . Spoke with patient's daughter to assessed needs and barriers with connecting to psychiatry . Patient Self Care Activities: . Patient is unable to independently navigate getting appointment without care coordination support  Please see past updates related to this goal by clicking on the "Past Updates" button in the selected goal          Outpatient Encounter Medications as of 12/22/2019  Medication Sig  . acetaminophen (TYLENOL) 500 MG tablet Take 1 tablet (500 mg total) by mouth every 6 (six) hours as needed.  Marland Kitchen aspirin-acetaminophen-caffeine (EXCEDRIN MIGRAINE) 250-250-65 MG tablet Take 2 tablets by mouth every 6 (six) hours as needed for headache.  Marland Kitchen atorvastatin (LIPITOR) 40 MG tablet Take 1 tablet (40 mg total) by mouth daily.  Marland Kitchen donepezil (ARICEPT) 5 MG tablet Take 1 tablet (5 mg total) by mouth at bedtime.  . enalapril-hydrochlorothiazide (VASERETIC) 10-25 MG tablet Take 1 tablet by mouth daily.  . fluticasone (FLONASE) 50 MCG/ACT nasal spray Place 2 sprays into both nostrils daily.  Marland Kitchen gabapentin (NEURONTIN) 300 MG capsule Take 1 capsule (300 mg total) by mouth 3 (three) times daily.  . Lidocaine (HM LIDOCAINE PATCH) 4 % PTCH Apply 1 patch daily as needed for pain relief  . Melatonin 1 MG CHEW Chew 1 mg by mouth at bedtime.  . metoprolol succinate (TOPROL-XL) 50 MG 24 hr tablet Take 1 tablet (50  mg total) by mouth at bedtime. Take with or immediately following a meal.  . naproxen (NAPROSYN) 250 MG tablet Take 1 tablet (250 mg total) by mouth 2 (two) times daily with a meal.  . venlafaxine XR (EFFEXOR-XR) 75 MG 24 hr capsule TAKE ONE CAPSULE BY MOUTH ONCE DAILY WITH BREAKFAST AFTER 4 WEEKS INCREASE TO 2 CAPSULES ONCE DAILY.  . VENTOLIN HFA 108 (90 Base) MCG/ACT inhaler INHALE 2 PUFFS BY  MOUTH EVERY 6 HOURS AS NEEDED FOR WHEEZING FOR SHORTNESS OF BREATH   No facility-administered encounter medications on file as of 12/22/2019.   Review of patient status, including review of consultants reports, relevant laboratory and other test results, and collaboration with appropriate care team members and the patient's provider was performed as part of comprehensive patient evaluation and provision of care management services.    Sammuel Hines, LCSW Care Management & Coordination  [facility] / [facility]   [phone] 9:25 AM

## 2019-12-23 ENCOUNTER — Emergency Department (HOSPITAL_COMMUNITY): Payer: 59

## 2019-12-23 ENCOUNTER — Encounter (HOSPITAL_COMMUNITY): Payer: Self-pay | Admitting: Emergency Medicine

## 2019-12-23 ENCOUNTER — Other Ambulatory Visit: Payer: Self-pay | Admitting: Family Medicine

## 2019-12-23 ENCOUNTER — Other Ambulatory Visit: Payer: Self-pay

## 2019-12-23 DIAGNOSIS — R0600 Dyspnea, unspecified: Secondary | ICD-10-CM

## 2019-12-23 LAB — CBC
HCT: 40.5 % (ref 36.0–46.0)
Hemoglobin: 13.4 g/dL (ref 12.0–15.0)
MCH: 30 pg (ref 26.0–34.0)
MCHC: 33.1 g/dL (ref 30.0–36.0)
MCV: 90.6 fL (ref 80.0–100.0)
Platelets: 214 10*3/uL (ref 150–400)
RBC: 4.47 MIL/uL (ref 3.87–5.11)
RDW: 12.4 % (ref 11.5–15.5)
WBC: 9 10*3/uL (ref 4.0–10.5)
nRBC: 0 % (ref 0.0–0.2)

## 2019-12-23 LAB — LACTIC ACID, PLASMA: Lactic Acid, Venous: 1.9 mmol/L (ref 0.5–1.9)

## 2019-12-23 LAB — URINALYSIS, ROUTINE W REFLEX MICROSCOPIC
Bilirubin Urine: NEGATIVE
Glucose, UA: NEGATIVE mg/dL
Hgb urine dipstick: NEGATIVE
Ketones, ur: NEGATIVE mg/dL
Leukocytes,Ua: NEGATIVE
Nitrite: NEGATIVE
Protein, ur: NEGATIVE mg/dL
Specific Gravity, Urine: 1.017 (ref 1.005–1.030)
pH: 6 (ref 5.0–8.0)

## 2019-12-23 LAB — BASIC METABOLIC PANEL
Anion gap: 12 (ref 5–15)
BUN: 16 mg/dL (ref 8–23)
CO2: 23 mmol/L (ref 22–32)
Calcium: 10.1 mg/dL (ref 8.9–10.3)
Chloride: 101 mmol/L (ref 98–111)
Creatinine, Ser: 0.86 mg/dL (ref 0.44–1.00)
GFR calc Af Amer: >60 mL/min (ref >60)
GFR calc non Af Amer: >60 mL/min (ref >60)
Glucose, Bld: 104 mg/dL — ABNORMAL HIGH (ref 70–99)
Potassium: 3.6 mmol/L (ref 3.5–5.1)
Sodium: 136 mmol/L (ref 135–145)

## 2019-12-23 LAB — TROPONIN I (HIGH SENSITIVITY)
Troponin I (High Sensitivity): 3 ng/L (ref <18)
Troponin I (High Sensitivity): <2 ng/L (ref <18)

## 2019-12-23 NOTE — ED Notes (Signed)
Pt has been called multiple times with no answer. Pt was witnessed ambulating out of the department.

## 2019-12-23 NOTE — ED Triage Notes (Signed)
Patient with chest pain, shortness of breath and fever for the last few days.  Patient denies any covid exposures.  Patient has had her vaccines.  Patient does have a headache and congestion at this time.

## 2019-12-28 ENCOUNTER — Ambulatory Visit: Payer: 59 | Admitting: Family Medicine

## 2019-12-28 LAB — CULTURE, BLOOD (ROUTINE X 2)
Culture: NO GROWTH
Culture: NO GROWTH
Special Requests: ADEQUATE
Special Requests: ADEQUATE

## 2019-12-31 ENCOUNTER — Ambulatory Visit (INDEPENDENT_AMBULATORY_CARE_PROVIDER_SITE_OTHER): Payer: 59 | Admitting: Family Medicine

## 2019-12-31 ENCOUNTER — Encounter: Payer: Self-pay | Admitting: Family Medicine

## 2019-12-31 ENCOUNTER — Ambulatory Visit: Payer: 59

## 2019-12-31 ENCOUNTER — Other Ambulatory Visit: Payer: Self-pay

## 2019-12-31 DIAGNOSIS — I10 Essential (primary) hypertension: Secondary | ICD-10-CM

## 2019-12-31 DIAGNOSIS — J45909 Unspecified asthma, uncomplicated: Secondary | ICD-10-CM | POA: Diagnosis not present

## 2019-12-31 DIAGNOSIS — F0280 Dementia in other diseases classified elsewhere without behavioral disturbance: Secondary | ICD-10-CM | POA: Diagnosis not present

## 2019-12-31 DIAGNOSIS — R05 Cough: Secondary | ICD-10-CM

## 2019-12-31 DIAGNOSIS — G300 Alzheimer's disease with early onset: Secondary | ICD-10-CM

## 2019-12-31 DIAGNOSIS — R059 Cough, unspecified: Secondary | ICD-10-CM

## 2019-12-31 DIAGNOSIS — R0600 Dyspnea, unspecified: Secondary | ICD-10-CM

## 2019-12-31 DIAGNOSIS — Z760 Encounter for issue of repeat prescription: Secondary | ICD-10-CM

## 2019-12-31 MED ORDER — VENTOLIN HFA 108 (90 BASE) MCG/ACT IN AERS: 1.0000 | INHALATION_SPRAY | RESPIRATORY_TRACT | 4 refills | Status: DC | PRN

## 2019-12-31 MED ORDER — BUDESONIDE-FORMOTEROL FUMARATE 80-4.5 MCG/ACT IN AERO
2.0000 | INHALATION_SPRAY | Freq: Two times a day (BID) | RESPIRATORY_TRACT | 0 refills | Status: DC
Start: 2019-12-31 — End: 2020-01-25

## 2019-12-31 MED ORDER — DONEPEZIL HCL 5 MG PO TABS: 5.0000 mg | ORAL_TABLET | Freq: Every day | ORAL | 3 refills | Status: DC

## 2019-12-31 MED ORDER — METOPROLOL SUCCINATE ER 50 MG PO TB24: 50.0000 mg | ORAL_TABLET | Freq: Every day | ORAL | 3 refills | Status: DC

## 2019-12-31 MED ORDER — ENALAPRIL-HYDROCHLOROTHIAZIDE 10-25 MG PO TABS: 1.0000 | ORAL_TABLET | Freq: Every day | ORAL | 3 refills | Status: DC

## 2019-12-31 NOTE — Progress Notes (Signed)
Patient reported cough visit was changed to virtual. Aquilla Solian, CMA

## 2020-01-01 NOTE — Progress Notes (Signed)
[facility] Telemedicine Visit  Patient consented to have virtual visit and was identified by name and date of birth. Method of visit: Video was attempted, but technology challenges prevented patient from using video, so visit was conducted via telephone.  Encounter participants: Patient: Nancy Gilbert - located at [facility] in the parking lot  Provider: Towanda Octave - located at [facility] Others (if applicable): Daughter present   Chief Complaint: Cough  HPI:  Cough  Pt endorses cough and congestion which is why she asked to kindly remain in the car for today's visit. She does have a history of asthma. Today she would like something for congestion.  She also endorses increased use of her albuterol inhaler more than 3 times a day over the last 1 month.  Has a productive chronic cough and sometimes the sputum is yellow.  Denies sick contacts or contacts.  Her daughter usually stays at home most of the time because she has dementia.  Denies fevers or chest pain.   ROS: per HPI  Pertinent PMHx: Dementia  Exam:  There were no vitals taken for this visit.  Respiratory: Unable to determine as I spoke with daughter.   Assessment/Plan:  Asthma Likely worsening severity of asthma as pt is using albuterol > 3 times a day. Prescribed Symbicort 1-2 puffs BID. Per daughter pt meant to be seen via pulmonology but was never contacted. Referred pt to pulmonology for asthma management. Safety precautions given to daughter.   Cough Cough and congestion likely 2/2 worsening asthma as above and possible viral URTI. Unable to examine patient due to virtual visit. Daugther denies COVID contacts but recommended PCR/rapid test as cannot exclude COVID.    Medication refill Provided patient refills on donepezil, albuterol, enalapril-HCTZ and metoprolol.    Time spent during visit with patient: 10  minutes

## 2020-01-03 DIAGNOSIS — R059 Cough, unspecified: Secondary | ICD-10-CM | POA: Insufficient documentation

## 2020-01-03 DIAGNOSIS — Z760 Encounter for issue of repeat prescription: Secondary | ICD-10-CM | POA: Insufficient documentation

## 2020-01-03 DIAGNOSIS — J45909 Unspecified asthma, uncomplicated: Secondary | ICD-10-CM | POA: Insufficient documentation

## 2020-01-03 NOTE — Assessment & Plan Note (Signed)
Cough and congestion likely 2/2 worsening asthma as above and possible viral URTI. Unable to examine patient due to virtual visit. Daugther denies COVID contacts but recommended PCR/rapid test as cannot exclude COVID.

## 2020-01-03 NOTE — Assessment & Plan Note (Signed)
Likely worsening severity of asthma as pt is using albuterol > 3 times a day. Prescribed Symbicort 1-2 puffs BID. Per daughter pt meant to be seen via pulmonology but was never contacted. Referred pt to pulmonology for asthma management. Safety precautions given to daughter.

## 2020-01-03 NOTE — Assessment & Plan Note (Signed)
Provided patient refills on donepezil, albuterol, enalapril-HCTZ and metoprolol.

## 2020-01-22 ENCOUNTER — Other Ambulatory Visit: Payer: Self-pay | Admitting: Family Medicine

## 2020-02-11 ENCOUNTER — Ambulatory Visit: Payer: 59 | Admitting: Neurology

## 2020-03-04 ENCOUNTER — Other Ambulatory Visit: Payer: Self-pay | Admitting: *Deleted

## 2020-03-04 DIAGNOSIS — E785 Hyperlipidemia, unspecified: Secondary | ICD-10-CM

## 2020-03-06 MED ORDER — ATORVASTATIN CALCIUM 40 MG PO TABS: 40.0000 mg | ORAL_TABLET | Freq: Every day | ORAL | 3 refills | Status: DC

## 2020-09-16 ENCOUNTER — Other Ambulatory Visit: Payer: Self-pay | Admitting: Family Medicine

## 2020-09-16 DIAGNOSIS — E785 Hyperlipidemia, unspecified: Secondary | ICD-10-CM

## 2020-09-16 DIAGNOSIS — G44229 Chronic tension-type headache, not intractable: Secondary | ICD-10-CM

## 2020-09-16 DIAGNOSIS — R079 Chest pain, unspecified: Secondary | ICD-10-CM

## 2020-09-16 DIAGNOSIS — G8929 Other chronic pain: Secondary | ICD-10-CM

## 2020-09-16 DIAGNOSIS — I10 Essential (primary) hypertension: Secondary | ICD-10-CM

## 2020-09-16 DIAGNOSIS — F3289 Other specified depressive episodes: Secondary | ICD-10-CM

## 2020-09-23 NOTE — Telephone Encounter (Signed)
2nd request for these.Bristol Osentoski Zimmerman Rumple, CMA

## 2020-09-26 ENCOUNTER — Encounter: Payer: Self-pay | Admitting: Family Medicine

## 2020-09-26 ENCOUNTER — Ambulatory Visit (INDEPENDENT_AMBULATORY_CARE_PROVIDER_SITE_OTHER): Payer: Self-pay | Admitting: Family Medicine

## 2020-09-26 ENCOUNTER — Other Ambulatory Visit: Payer: Self-pay

## 2020-09-26 VITALS — BP 118/76 | HR 86 | Ht 63.0 in | Wt 168.0 lb

## 2020-09-26 DIAGNOSIS — G4700 Insomnia, unspecified: Secondary | ICD-10-CM

## 2020-09-26 DIAGNOSIS — J45909 Unspecified asthma, uncomplicated: Secondary | ICD-10-CM

## 2020-09-26 DIAGNOSIS — I10 Essential (primary) hypertension: Secondary | ICD-10-CM

## 2020-09-26 DIAGNOSIS — R0600 Dyspnea, unspecified: Secondary | ICD-10-CM

## 2020-09-26 DIAGNOSIS — F0280 Dementia in other diseases classified elsewhere without behavioral disturbance: Secondary | ICD-10-CM

## 2020-09-26 DIAGNOSIS — G300 Alzheimer's disease with early onset: Secondary | ICD-10-CM

## 2020-09-26 MED ORDER — VENTOLIN HFA 108 (90 BASE) MCG/ACT IN AERS: 1.0000 | INHALATION_SPRAY | RESPIRATORY_TRACT | 4 refills | Status: DC | PRN

## 2020-09-26 MED ORDER — MELATONIN 1 MG PO CHEW: 1.0000 mg | CHEWABLE_TABLET | Freq: Every evening | ORAL | 0 refills | Status: DC

## 2020-09-26 MED ORDER — BUDESONIDE-FORMOTEROL FUMARATE 80-4.5 MCG/ACT IN AERO: 2.0000 | INHALATION_SPRAY | Freq: Two times a day (BID) | RESPIRATORY_TRACT | 0 refills | Status: DC

## 2020-09-26 MED ORDER — METOPROLOL SUCCINATE ER 50 MG PO TB24: 50.0000 mg | ORAL_TABLET | Freq: Every day | ORAL | 0 refills | Status: DC

## 2020-09-26 NOTE — Progress Notes (Signed)
    SUBJECTIVE:   CHIEF COMPLAINT / HPI:   Medication Refills Patient requests refills on several of her medications. Brings a written list from her daughter of the medications she needs. Her daughter manages her medications due to patient's dementia. -Atorvastatin 40mg  daily -Metoprolol 50mg  daily -Enalapril-HCTZ 10-25mg  daily -Venlafaxine 75mg , 2 pills daily -Ventolin -Budesonide -Melatonin 1mg  nightly  Memory Difficulty Patient reports difficulty with her memory. States she forgets everything- she forgets to eat, forgets her medications, forgets words. Her daughter is her caretaker. Of note, she was diagnosed with early-onset Alzheimer's in 2019 (seen in geri clinic, [facility] 14/30 at that time). Per patient, she performs ADLs without difficulty but needs help with all iADLs.  Fall Patient states she fell a few days ago. She mis-stepped on the stairs. Caught herself with her arms. Did not sustain any injuries. Did not hit her head. No LOC. Walks with a cane sometimes but not always.  Veins sticking out on legs Bothers her cosmetically. Not painful.  PERTINENT  PMH / PSH: Alzheimer's dementia w/behavioral disturbance, MDD, HTN, HLD  OBJECTIVE:   BP 118/76   Pulse 86   Ht 5\' 3"  (1.6 m)   Wt 168 lb (76.2 kg)   SpO2 97%   BMI 29.76 kg/m   Gen: alert, well-appearing, NAD CV: RRR, normal S1/S2 without m/r/g Resp: normal WOB, lungs CTAB Ext: varicose veins present on bilateral lower extremities. No LE edema or tenderness Neuro: unsteady gait, able to maintain conversation although has evident difficulty with memory  ASSESSMENT/PLAN:   Alzheimer's Dementia Unclear whether patient is currently on medication. Aricept 5mg  on patient med list but she does not know any of her medications and her daughter/caregiver is not present at today's appointment. Recommended patient bring her daughter to future appointments. Encouraged regular exercise and socialization.  Medication  Refills Refills provided for several of her medications today.   Fall Fortunately, she did not sustain any injuries. Home PT ordered.  Varicose Veins Discussed overall benign nature and mentioned laser treatment for cosmetic reasons if she desires.  Interpreter Heidi [phone] used for duration of visit.  2020, MD [facility]

## 2020-09-26 NOTE — Patient Instructions (Addendum)
It was great to see you!  Things we discussed at today's visit: - I have sent medication refills to your pharmacy. - I have put in a referral for physical therapy. They will contact you or your daughter to arrange services.  - For your memory, try to get regular exercise and to socialize with family/friends as much as possible - You have varicose veins on your legs. These are not harmful. Some people choose to get laser surgery for cosmetic reasons, but this is not necessary and can be very expensive.  It would be very helpful to bring your daughter to appointments in the future.  Take care and seek immediate care sooner if you develop any concerns.  Dr. [facility]

## 2020-12-20 NOTE — Progress Notes (Signed)
 *  Spanish interpreter present during entire encounter [phone]  SUBJECTIVE:   CHIEF COMPLAINT / HPI:   [name] is a 60 yo F who presents with her daughter for the below.   Insomnia Having trouble sleeping. Needs refill on melatonin. When taking she sleeps well.   Incontinent episode One episode of incontinence 2 months ago. Patient was not aware she was urinating on herself, her husband had to inform her. Has never happened before and has not happened since. Denies saddle anesthesia.   Medication refill Refill on several medications.   PERTINENT  PMH / PSH: Alzheimer's dementia, GERD, HTN  OBJECTIVE:   BP 125/80   Pulse 79   Ht 5' 2.6" (1.59 m)   Wt 168 lb 6.4 oz (76.4 kg)   SpO2 98%   BMI 30.22 kg/m   General: Appears well, no acute distress. Age appropriate. Cardiac: RRR, normal heart sounds, no murmurs Respiratory: CTAB, normal effort Extremities: No edema or cyanosis. Neuro: alert and oriented, no focal deficits. CN II-XII grossly intact. Normal sensation bilaterally. Gait appears normal. Has difficulty following commands.  Psych: normal affect. Answers questions inappropriately or does not seem to understand context of conversation.  ASSESSMENT/PLAN:   Insomnia, unspecified type Consider need for additional medication if patient does not respond to restart of melatonin. - Melatonin 1 MG CHEW; Chew 1 mg by mouth at bedtime.  Dispense: 90 tablet; Refill: 0   Urinary incontinence, unspecified type 1 Episode several months ago. Neuro exam normal. Coupled with hx of dementia likely overflow incontinence. Discussed scheduled bathroom trips and follow up if continues to office. For now will check urine.  - Urinalysis, dipstick only - POCT urinalysis dipstick  Nancy Jumbo, DO [facility]

## 2020-12-21 ENCOUNTER — Ambulatory Visit (INDEPENDENT_AMBULATORY_CARE_PROVIDER_SITE_OTHER): Payer: 59 | Admitting: Family Medicine

## 2020-12-21 ENCOUNTER — Other Ambulatory Visit: Payer: Self-pay

## 2020-12-21 VITALS — BP 125/80 | HR 79 | Ht 62.6 in | Wt 168.4 lb

## 2020-12-21 DIAGNOSIS — G300 Alzheimer's disease with early onset: Secondary | ICD-10-CM

## 2020-12-21 DIAGNOSIS — F0280 Dementia in other diseases classified elsewhere without behavioral disturbance: Secondary | ICD-10-CM

## 2020-12-21 DIAGNOSIS — G44229 Chronic tension-type headache, not intractable: Secondary | ICD-10-CM

## 2020-12-21 DIAGNOSIS — M79632 Pain in left forearm: Secondary | ICD-10-CM

## 2020-12-21 DIAGNOSIS — I10 Essential (primary) hypertension: Secondary | ICD-10-CM

## 2020-12-21 DIAGNOSIS — E785 Hyperlipidemia, unspecified: Secondary | ICD-10-CM | POA: Diagnosis not present

## 2020-12-21 DIAGNOSIS — R32 Unspecified urinary incontinence: Secondary | ICD-10-CM

## 2020-12-21 DIAGNOSIS — J45909 Unspecified asthma, uncomplicated: Secondary | ICD-10-CM

## 2020-12-21 DIAGNOSIS — F3289 Other specified depressive episodes: Secondary | ICD-10-CM

## 2020-12-21 DIAGNOSIS — M79631 Pain in right forearm: Secondary | ICD-10-CM

## 2020-12-21 DIAGNOSIS — G4700 Insomnia, unspecified: Secondary | ICD-10-CM

## 2020-12-21 LAB — POCT URINALYSIS DIP (MANUAL ENTRY)
Bilirubin, UA: NEGATIVE
Blood, UA: NEGATIVE
Glucose, UA: NEGATIVE mg/dL
Ketones, POC UA: NEGATIVE mg/dL
Leukocytes, UA: NEGATIVE
Nitrite, UA: NEGATIVE
Protein Ur, POC: NEGATIVE mg/dL
Spec Grav, UA: 1.025 (ref 1.010–1.025)
Urobilinogen, UA: 0.2 E.U./dL
pH, UA: 7 (ref 5.0–8.0)

## 2020-12-21 MED ORDER — METOPROLOL SUCCINATE ER 50 MG PO TB24: 50.0000 mg | ORAL_TABLET | Freq: Every day | ORAL | 0 refills | Status: DC

## 2020-12-21 MED ORDER — MELATONIN 1 MG PO CHEW: 1.0000 mg | CHEWABLE_TABLET | Freq: Every evening | ORAL | 0 refills | Status: DC

## 2020-12-21 MED ORDER — VENTOLIN HFA 108 (90 BASE) MCG/ACT IN AERS: 1.0000 | INHALATION_SPRAY | RESPIRATORY_TRACT | 4 refills | Status: DC | PRN

## 2020-12-21 MED ORDER — ENALAPRIL-HYDROCHLOROTHIAZIDE 10-25 MG PO TABS: 1.0000 | ORAL_TABLET | Freq: Every day | ORAL | 0 refills | Status: DC

## 2020-12-21 MED ORDER — DONEPEZIL HCL 5 MG PO TABS: 5.0000 mg | ORAL_TABLET | Freq: Every day | ORAL | 3 refills | Status: DC

## 2020-12-21 MED ORDER — GABAPENTIN 300 MG PO CAPS: 300.0000 mg | ORAL_CAPSULE | Freq: Three times a day (TID) | ORAL | 3 refills | Status: DC

## 2020-12-21 MED ORDER — BUDESONIDE-FORMOTEROL FUMARATE 80-4.5 MCG/ACT IN AERO: 2.0000 | INHALATION_SPRAY | Freq: Two times a day (BID) | RESPIRATORY_TRACT | 0 refills | Status: DC

## 2020-12-21 MED ORDER — ASPIRIN-ACETAMINOPHEN-CAFFEINE 250-250-65 MG PO TABS: 2.0000 | ORAL_TABLET | Freq: Four times a day (QID) | ORAL | 2 refills | Status: DC | PRN

## 2020-12-21 MED ORDER — ATORVASTATIN CALCIUM 40 MG PO TABS: 40.0000 mg | ORAL_TABLET | Freq: Every day | ORAL | 0 refills | Status: DC

## 2020-12-21 MED ORDER — VENLAFAXINE HCL ER 75 MG PO CP24: ORAL_CAPSULE | ORAL | 0 refills | Status: DC

## 2020-12-21 NOTE — Patient Instructions (Signed)
Thank you for coming today, it was good to see you.   Today we refilled your melatonin. Hopefully you will sleep better with restarting this medication.   I will check your urine because of your incontinent episode. Consider scheduled bathroom breaks if this continues to occur.   We also discussed carrying an information card in case of emergencies or you get lost.   I have refilled your medications today.  Please follow up soon for a physical exam.   Dr. Salvadore Dom

## 2020-12-26 ENCOUNTER — Encounter: Payer: Self-pay | Admitting: Family Medicine

## 2021-01-20 ENCOUNTER — Ambulatory Visit: Payer: 59 | Admitting: Family Medicine

## 2021-01-27 ENCOUNTER — Other Ambulatory Visit (HOSPITAL_COMMUNITY): Payer: Self-pay

## 2021-03-15 ENCOUNTER — Encounter: Payer: Self-pay | Admitting: Family Medicine

## 2021-03-15 ENCOUNTER — Ambulatory Visit (INDEPENDENT_AMBULATORY_CARE_PROVIDER_SITE_OTHER): Payer: 59 | Admitting: Family Medicine

## 2021-03-15 ENCOUNTER — Other Ambulatory Visit: Payer: Self-pay

## 2021-03-15 VITALS — BP 108/77 | HR 93 | Wt 171.0 lb

## 2021-03-15 DIAGNOSIS — M542 Cervicalgia: Secondary | ICD-10-CM

## 2021-03-15 DIAGNOSIS — G300 Alzheimer's disease with early onset: Secondary | ICD-10-CM | POA: Diagnosis not present

## 2021-03-15 DIAGNOSIS — Z23 Encounter for immunization: Secondary | ICD-10-CM | POA: Diagnosis not present

## 2021-03-15 DIAGNOSIS — F339 Major depressive disorder, recurrent, unspecified: Secondary | ICD-10-CM | POA: Diagnosis not present

## 2021-03-15 DIAGNOSIS — F0280 Dementia in other diseases classified elsewhere without behavioral disturbance: Secondary | ICD-10-CM

## 2021-03-15 NOTE — Progress Notes (Signed)
    SUBJECTIVE:   CHIEF COMPLAINT / HPI:   Neck Pain Left sided, present x1 week, described as pinching pain. Reports the pain is "inside". No pain to the touch. Present all the time but worse with movement. Reports she tried a medication one time for relief but does not know the name. Has not tried heat or ice. No known trauma. No weakness, numbness/tingling in arms. No associated fever. Endorses associated frontal headache behind her eyes over the past week as well. No nausea/vomiting/photophobia.  Depression Patient had positive PHQ-9 score today and has longstanding hx of depression. Occasionally feels she would be better off dead, but denies any plan or intent to harm herself. Does not know the names of any of her medications.  Memory Issues Patient and husband complain of ongoing memory issues. They state patient forgets to eat, forgets what she goes to the store for, forgets everything. Has been worked up in the past, [facility] 14/30 in Nov 2019. Neither patient nor her husband know any of her medications.   PERTINENT  PMH / PSH: Alzheimer's dementia, depression, tension headaches, HLD, HTN  OBJECTIVE:   BP 108/77   Pulse 93   Wt 171 lb (77.6 kg)   SpO2 100%   BMI 30.68 kg/m   General: NAD, pleasant, able to participate in exam Respiratory: No respiratory distress Skin: warm and dry, no rashes noted Psych: Normal affect Neuro: grossly intact Neck: inspection normal, ROM normal, no tenderness to palpation  ASSESSMENT/PLAN:   Neck Pain L sided x1 week. Likely muscular etiology. No red flags on history and exam is unremarkable. Suspect this is contributing to her tension headaches. Advised conservative management with Acetaminophen and heating pad. If worsening can consider imaging in the future. Last c-spine xray in 2018 showed degenerative changes.   Alzheimer's dementia [facility]) Patient had MOCA of 14/30 back in 2019 and was diagnosed with early onset Alzheimer's. Aricept 5mg  is  on patient's medication list. Unclear whether she is taking this as she does not know any of her medications.  Symptoms relatively unchanged from prior. No significant behavior disturbance. -Encouraged her to bring medications to next visit -Also encouraged her to bring her daughter to future appointments -Do not feel additional workup needed at this time -Her depression may also be contributing -Encouraged socialization, regular exercise and healthy dietary choices  MDD (major depressive disorder) Not well controlled. PHQ-9 score of 23 today, with passive thoughts of wanting to be dead. No plan or intent. This has been a longstanding problem for her. Med list includes Venlafaxine 75mg  daily although unclear if she is taking this as she does not know any of her medications. -Advised to bring medications to next appointment -Will not make any changes today, as her daughter is not present and patient unable to manage her own meds due to memory problems. -She may benefit from psychiatry referral for poorly controlled depression   I-pad Spanish interpreter used for duration of encounter.  , MD [facility]

## 2021-03-15 NOTE — Patient Instructions (Addendum)
It was great to see you!  For your neck pain, try using a heating pad for 15 minutes up to 3 times daily. You can also take Tylenol 650mg  every 6 hours as needed for neck pain and headaches.  Please bring ALL your medications to your next visit. Please also bring your daughter with you to your next appointment if possible. This will be very helpful.  Here is the phone number for an eye doctor to have your eyes checked- [phone]  ([facility], [address] [facility] [address])  Take care and seek immediate care sooner if you develop any concerns.  Dr. [facility]

## 2021-03-17 NOTE — Assessment & Plan Note (Signed)
Not well controlled. PHQ-9 score of 23 today, with passive thoughts of wanting to be dead. No plan or intent. This has been a longstanding problem for her. Med list includes Venlafaxine 75mg  daily although unclear if she is taking this as she does not know any of her medications. -Advised to bring medications to next appointment -Will not make any changes today, as her daughter is not present and patient unable to manage her own meds due to memory problems. -She may benefit from psychiatry referral for poorly controlled depression

## 2021-03-17 NOTE — Assessment & Plan Note (Addendum)
Patient had MOCA of 14/30 back in 2019 and was diagnosed with early onset Alzheimer's. Aricept 5mg  is on patient's medication list. Unclear whether she is taking this as she does not know any of her medications.  Symptoms relatively unchanged from prior. No significant behavior disturbance. -Encouraged her to bring medications to next visit -Also encouraged her to bring her daughter to future appointments -Do not feel additional workup needed at this time -Her depression may also be contributing -Encouraged socialization, regular exercise and healthy dietary choices

## 2021-03-21 ENCOUNTER — Other Ambulatory Visit: Payer: Self-pay | Admitting: Family Medicine

## 2021-03-21 DIAGNOSIS — I10 Essential (primary) hypertension: Secondary | ICD-10-CM

## 2021-03-22 ENCOUNTER — Other Ambulatory Visit: Payer: Self-pay | Admitting: Family Medicine

## 2021-03-22 DIAGNOSIS — E785 Hyperlipidemia, unspecified: Secondary | ICD-10-CM

## 2021-03-22 DIAGNOSIS — I10 Essential (primary) hypertension: Secondary | ICD-10-CM

## 2021-03-22 DIAGNOSIS — F3289 Other specified depressive episodes: Secondary | ICD-10-CM

## 2021-07-14 ENCOUNTER — Other Ambulatory Visit: Payer: Self-pay | Admitting: Family Medicine

## 2021-07-14 DIAGNOSIS — I10 Essential (primary) hypertension: Secondary | ICD-10-CM

## 2021-07-14 DIAGNOSIS — F3289 Other specified depressive episodes: Secondary | ICD-10-CM

## 2021-10-05 ENCOUNTER — Other Ambulatory Visit: Payer: Self-pay | Admitting: Family Medicine

## 2021-10-05 DIAGNOSIS — F3289 Other specified depressive episodes: Secondary | ICD-10-CM

## 2021-10-05 DIAGNOSIS — I10 Essential (primary) hypertension: Secondary | ICD-10-CM

## 2022-01-02 ENCOUNTER — Telehealth: Payer: Self-pay

## 2022-01-02 ENCOUNTER — Encounter: Payer: Self-pay | Admitting: Family Medicine

## 2022-01-02 ENCOUNTER — Ambulatory Visit (INDEPENDENT_AMBULATORY_CARE_PROVIDER_SITE_OTHER): Payer: Self-pay | Admitting: Family Medicine

## 2022-01-02 VITALS — BP 130/88 | HR 76 | Ht 62.0 in | Wt 161.1 lb

## 2022-01-02 DIAGNOSIS — F0280 Dementia in other diseases classified elsewhere without behavioral disturbance: Secondary | ICD-10-CM

## 2022-01-02 DIAGNOSIS — R413 Other amnesia: Secondary | ICD-10-CM

## 2022-01-02 DIAGNOSIS — E8889 Other specified metabolic disorders: Secondary | ICD-10-CM

## 2022-01-02 DIAGNOSIS — G300 Alzheimer's disease with early onset: Secondary | ICD-10-CM

## 2022-01-02 DIAGNOSIS — I10 Essential (primary) hypertension: Secondary | ICD-10-CM

## 2022-01-02 NOTE — Telephone Encounter (Signed)
Spoke with patients daughter. Informed her of patients MRI for Thur. Sep. 14th at [facility] at 2:30. She was also told to enter at entrance C at the hospital. Also was informed that a referral to neurology will be placed in the meantime. Aquilla Solian, CMA

## 2022-01-02 NOTE — Progress Notes (Signed)
   Interpreter ID#: [id number]  SUBJECTIVE:   CHIEF COMPLAINT / HPI:   HPI: Memory impairment: The patient came in with her daughter for worsening memory loss. She is on Aricept and is compliant with all medications. Although, the patient and her daughter could not list her medications or dose, nor did they come in with the prescription bottles. She endorses intermittent headache, which has been ongoing for a while as well. She also endorses pain behind her eyes but no vision change. She sees ophthalmology yearly.  Right shoulder pain C/O right shoulder pain has been ongoing for more than seven months. Pain radiates to her hand. She uses Gabapentin with some improvement but feels it is getting worse. No numbness or weakness.  PERTINENT  PMH / PSH: PMHx reviewed  OBJECTIVE:   BP 130/88   Pulse 76   Ht 5\' 2"  (1.575 m)   Wt 161 lb 2 oz (73.1 kg)   SpO2 100%   BMI 29.47 kg/m   Physical Exam Vitals reviewed.  Cardiovascular:     Rate and Rhythm: Normal rate and regular rhythm.     Heart sounds: Normal heart sounds. No murmur heard. Pulmonary:     Effort: Pulmonary effort is normal. No respiratory distress.     Breath sounds: Normal breath sounds.  Musculoskeletal:     Right shoulder: Normal.     Left shoulder: Normal.  Neurological:     Mental Status: She is alert.     Cranial Nerves: Cranial nerves 2-12 are intact.     Sensory: Sensation is intact.     Motor: No weakness or tremor.     Gait: Gait is intact.     Deep Tendon Reflexes: Reflexes normal.      ASSESSMENT/PLAN:   Alzheimer's dementia (HCC) Record reviewed. I am uncertain if she takes her Aricept as prescribed, although her daughter stated that she takes all the medication on the list. She had a recent MoCA of 14/30. Also, MRI brain and labs in 2019. Given new headaches and worsening symptoms, I repeated her MRI and obtained lab work. I will contact her soon with her results. Consider neurology referral at  some point. Return to PCP with medication and possibly increase the dose of her Aricept. They agreed with the plan.    Right shoulder pain: ?? Arthritis vs neuropathy. Continue Gabapentin at her current dose per PCP. May add on Tylenol as needed. See PCP soon for reassessment.    While completing her encounter note, I noticed she is behind on some cancer screening. I am unclear if she already reviewed outside record with her PCP. I will forward message to PCP to follow. I already helped her schedule PCP appointment during this visit.  2020, MD [facility]

## 2022-01-02 NOTE — Assessment & Plan Note (Signed)
Record reviewed. I am uncertain if she takes her Aricept as prescribed, although her daughter stated that she takes all the medication on the list. She had a recent MoCA of 14/30. Also, MRI brain and labs in 2019. Given new headaches and worsening symptoms, I repeated her MRI and obtained lab work. I will contact her soon with her results. Consider neurology referral at some point. Return to PCP with medication and possibly increase the dose of her Aricept. They agreed with the plan.

## 2022-01-02 NOTE — Patient Instructions (Signed)
Dementia Dementia is a condition that affects the way the brain works. It often affects memory and thinking. There are many types of dementia. Some types get worse with time and cannot be reversed. Some types of dementia include: Alzheimer's disease. This is the most common type. Vascular dementia. This type may happen due to a stroke. Lewy body dementia. This type may happen to people who have Parkinson's disease. Frontotemporal dementia. This type is caused by damage to nerve cells in certain parts of the brain. Some people may have more than one type. What are the causes? This condition is caused by damage to cells in the brain. Some causes that cannot be reversed include: Having a condition that affects the blood vessels of the brain, such as diabetes, heart disease, or blood vessel disease. Changes to genes. Some causes that can be reversed or slowed include: Injury to the brain. Certain medicines. Infection. Not having enough vitamin B12 in the body, or thyroid problems. A tumor, blood clot, or too much fluid in the brain. Certain diseases that cause your body's defense system (immune system) to attack healthy parts of the body. What are the signs or symptoms? Problems remembering events or people. Having trouble taking a bath or putting clothes on. Forgetting appointments. Forgetting to pay bills. Trouble planning and making meals. Having trouble speaking. Getting lost easily. Changes in behavior or mood. How is this treated? Treatment depends on the cause of the dementia. It might include: Taking medicines for symptoms or to help control or slow down the dementia. Treating the cause of your dementia. Your doctor can help you find support groups and other doctors who can help with your care. Follow these instructions at home: Medicines Take over-the-counter and prescription medicines only as told by your doctor. Use a pill organizer to help you manage your  medicines. Avoidtaking medicines for pain or for sleep. Lifestyle Make healthy choices: Be active as told by your doctor. Do not smoke or use any products that contain nicotine or tobacco. If you need help quitting, ask your doctor. Do not drink alcohol. When you get stressed, do something that will help you relax. Your doctor can give you tips. Spend time with other people. Make sure you get good sleep. To get good sleep: Try not to take naps during the day. Keep your bedroom dark and cool. In the few hours before you go to bed, try not to do any exercise. Do not have foods and drinks with caffeine at night. Eating and drinking Drink enough fluid to keep your pee (urine) pale yellow. Eat a healthy diet. General instructions  Talk with your doctor to figure out: What you need help with. What your safety needs are. Ask your doctor if it is safe for you to drive. If told, wear a bracelet that tracks where you are or shows that you are a person with memory loss. Work with your family to make big decisions. Keep all follow-up visits. Where to find more information Alzheimer's Association: www.alz.org National Institute on Aging: www.nia.nih.gov/alzheimers World Health Organization: www.who.int Contact a doctor if: You have any new symptoms. Your symptoms get worse. You have problems with swallowing or choking. Get help right away if: You feel very sad, or feel that you want to harm yourself. Your family members are worried for your safety. Get help right away if you feel like you may hurt yourself or others, or have thoughts about taking your own life. Go to your nearest emergency room or:   Call your local emergency services (911 in the [place] Call the National Suicide Prevention Lifeline at 1-800-273-8255 or 988 in the [place] This is open 24 hours a day. Text the Crisis Text Line at 741741. Summary Dementia often affects memory and thinking. Some types of dementia get worse with  time and cannot be reversed. Treatment for this condition depends on the cause. Talk with your doctor to figure out what you need help with. Your doctor can help you find support groups and other doctors who can help with your care. This information is not intended to replace advice given to you by your health care provider. Make sure you discuss any questions you have with your health care provider. Document Revised: 11/23/2020 Document Reviewed: 09/14/2019 Elsevier Patient Education  2023 Elsevier Inc.  

## 2022-01-03 ENCOUNTER — Telehealth: Payer: Self-pay | Admitting: Family Medicine

## 2022-01-03 LAB — LIPID PANEL
Chol/HDL Ratio: 6.4 ratio — ABNORMAL HIGH (ref 0.0–4.4)
Cholesterol, Total: 235 mg/dL — ABNORMAL HIGH (ref 100–199)
HDL: 37 mg/dL — ABNORMAL LOW (ref >39)
LDL Chol Calc (NIH): 136 mg/dL — ABNORMAL HIGH (ref 0–99)
Triglycerides: 344 mg/dL — ABNORMAL HIGH (ref 0–149)
VLDL Cholesterol Cal: 62 mg/dL — ABNORMAL HIGH (ref 5–40)

## 2022-01-03 LAB — BASIC METABOLIC PANEL
BUN/Creatinine Ratio: 18 (ref 12–28)
BUN: 14 mg/dL (ref 8–27)
CO2: 25 mmol/L (ref 20–29)
Calcium: 9.9 mg/dL (ref 8.7–10.3)
Chloride: 97 mmol/L (ref 96–106)
Creatinine, Ser: 0.8 mg/dL (ref 0.57–1.00)
Glucose: 92 mg/dL (ref 70–99)
Potassium: 4.1 mmol/L (ref 3.5–5.2)
Sodium: 139 mmol/L (ref 134–144)
eGFR: 83 mL/min/{1.73_m2} (ref >59)

## 2022-01-03 LAB — TSH: TSH: 1.52 u[IU]/mL (ref 0.450–4.500)

## 2022-01-03 LAB — VITAMIN B12: Vitamin B-12: 938 pg/mL (ref 232–1245)

## 2022-01-03 NOTE — Telephone Encounter (Signed)
Interpreter ID: [id number]  Discussed test results.  FLP elevated. The daughter confirmed that she takes her Lipitor regularly. The plan is to continue the same dose until she sees PCP in 2-3 weeks for medication review and adjustment. She will get her MRI done as planned, and then we can consider referral to neurology as needed. The daughter agreed with the plan.  Daughter requested that her appointment with Dr. Anner Crete be moved to after her MRI so she can discuss result with her PCP when she comes in. Appointment moved from Sept 13th to 20th. ED precautions discussed.

## 2022-01-22 ENCOUNTER — Other Ambulatory Visit: Payer: Self-pay | Admitting: Family Medicine

## 2022-01-22 DIAGNOSIS — I10 Essential (primary) hypertension: Secondary | ICD-10-CM

## 2022-01-22 DIAGNOSIS — E785 Hyperlipidemia, unspecified: Secondary | ICD-10-CM

## 2022-01-22 DIAGNOSIS — F0280 Dementia in other diseases classified elsewhere without behavioral disturbance: Secondary | ICD-10-CM

## 2022-01-23 ENCOUNTER — Other Ambulatory Visit: Payer: Self-pay

## 2022-01-23 DIAGNOSIS — F3289 Other specified depressive episodes: Secondary | ICD-10-CM

## 2022-01-23 DIAGNOSIS — I10 Essential (primary) hypertension: Secondary | ICD-10-CM

## 2022-01-23 MED ORDER — METOPROLOL SUCCINATE ER 50 MG PO TB24: ORAL_TABLET | ORAL | 0 refills | Status: DC

## 2022-01-23 MED ORDER — VENLAFAXINE HCL ER 75 MG PO CP24: ORAL_CAPSULE | ORAL | 0 refills | Status: DC

## 2022-01-24 ENCOUNTER — Ambulatory Visit: Payer: Medicaid Other | Admitting: Family Medicine

## 2022-01-25 ENCOUNTER — Ambulatory Visit (HOSPITAL_COMMUNITY)
Admission: RE | Admit: 2022-01-25 | Discharge: 2022-01-25 | Disposition: A | Discharge status: Home / Self Care | Payer: Medicaid Other | Source: Ambulatory Visit | Attending: Family Medicine | Admitting: Family Medicine

## 2022-01-25 DIAGNOSIS — R413 Other amnesia: Secondary | ICD-10-CM | POA: Insufficient documentation

## 2022-01-25 MED ORDER — GADOPICLENOL 0.5 MMOL/ML IV SOLN
7.0000 mL | Freq: Once | INTRAVENOUS | Status: AC | PRN
  Administered 2022-01-25: 7 mL via INTRAVENOUS

## 2022-01-26 NOTE — Progress Notes (Signed)
Please contact patient and advise her that her brain MRI is negative for acute brain findings. Brain size is above 5%tile, but better interpreted by a neurologist. Have her f/u with PCP as planned to discuss neurology referral. Thanks.

## 2022-01-31 ENCOUNTER — Ambulatory Visit (INDEPENDENT_AMBULATORY_CARE_PROVIDER_SITE_OTHER): Payer: Self-pay | Admitting: Family Medicine

## 2022-01-31 ENCOUNTER — Encounter: Payer: Self-pay | Admitting: Family Medicine

## 2022-01-31 VITALS — BP 129/89 | HR 70 | Ht 62.0 in | Wt 160.4 lb

## 2022-01-31 DIAGNOSIS — Z1231 Encounter for screening mammogram for malignant neoplasm of breast: Secondary | ICD-10-CM

## 2022-01-31 DIAGNOSIS — G300 Alzheimer's disease with early onset: Secondary | ICD-10-CM

## 2022-01-31 DIAGNOSIS — R413 Other amnesia: Secondary | ICD-10-CM

## 2022-01-31 DIAGNOSIS — Z23 Encounter for immunization: Secondary | ICD-10-CM

## 2022-01-31 DIAGNOSIS — F0280 Dementia in other diseases classified elsewhere without behavioral disturbance: Secondary | ICD-10-CM

## 2022-01-31 DIAGNOSIS — F339 Major depressive disorder, recurrent, unspecified: Secondary | ICD-10-CM

## 2022-01-31 MED ORDER — GABAPENTIN 300 MG PO CAPS: 300.0000 mg | ORAL_CAPSULE | Freq: Two times a day (BID) | ORAL | 2 refills | Status: DC

## 2022-01-31 NOTE — Patient Instructions (Addendum)
It was great to see you!  I have referred you to the neurologist for your memory problems. We will also get neuropsych testing. Someone will reach out to you in a few weeks to schedule an appointment.  I have referred you to our Education officer, museum. They will call you and discuss community resources for people with Alzheimer's and their caregivers.  Please bring a copy of the records from her GYN surgery to our office.  I have ordered a mammogram (screening test for breast cancer). Please call [facility] at [phone] to schedule an appointment. They are located at [place].  I sent Gabapentin refills to your pharmacy.  Follow up with me in 2 months.   Dr Rock Nephew

## 2022-01-31 NOTE — Progress Notes (Signed)
    SUBJECTIVE:   CHIEF COMPLAINT / HPI:   Memory Loss -Ongoing issue for several years (since at least 2019 per chart review) -Previously diagnosed with early-onset Alzheimer's in 2019 based on MoCA-Basic (w/spanish interpreter) score of 14/30 -Taking donepezil 5mg  daily since November 2019 -Per daughter: patient forgets words, where she puts things, does not know month or year. Always knows where she is. Only one instance where she forgot family members. -Patient is independent in all ADLs, needs assistance with iADLs -Patient aware of her memory loss, writes things down-- this apparently has caused significant depression (see below)  Depression -patient states she feels very down, depressed -states she cries frequently -states she sometimes eats, sometimes doesn't -she reports her depression mostly revolves around her memory, she feels sad that she can't remember things -no family h/o memory issues -denies SI or self-harm -good support system (lives with her daughter, son-in-law, grandchildren)  Health Maintenance Due for mammogram and colonoscopy  Patient states she had a total hyste[place] in 3244 for GYN cancer. Daughter has some of these records and will bring to our office  OBJECTIVE:   BP 129/89   Pulse 70   Ht 5\' 2"  (1.575 m)   Wt 160 lb 6.4 oz (72.8 kg)   SpO2 99%   BMI 29.34 kg/m   Gen: alert, well-appearing, able to participate in history and exam Respiratory: No respiratory distress Skin: warm and dry, no rashes noted Psych: initially appropriate mood and affect, then began sobbing uncontrollably Neuro: able to provide fairly specific history (regarding her GYN history in the [place], her mood and memory), later began sobbing, would not attempt to answer orientation questions. Normal gait. Full and symmetric strength in all extremities  ASSESSMENT/PLAN:   Alzheimer's dementia ([place]) Diagnosed in 2019 based on MoCA-Basic  (Spanish) of 14/30. Remains on Aricept. Had unremarkable brain MRI this month (partially empty sella, whole-brain volume 55th percentile, hippocampal volume 72nd percentile). Curiously, patient is very aware of her memory challenges and they haven't seemed to progress significantly since 2019. Difficult to tease out whether her depression is a result of her memory changes or vice versa. Diagnosis is not entirely clear at this point. Will refer for neuropsych testing and neurology evaluation.  MDD (major depressive disorder) Not well controlled. PHQ-9 score of 21 today (no SI) and patient sobbing during encounter. Could be dementia related behavioral disturbance but this is less likely. Refer for neuropsych testing as above. Continue venlafaxine 75mg  daily for now.   Health Maintenance -mammogram ordered -flu shot administered today -due for colonoscopy but will defer for now given magnitude of above issues   Alcus Dad, MD [place]

## 2022-02-02 ENCOUNTER — Telehealth: Payer: Self-pay | Admitting: *Deleted

## 2022-02-02 NOTE — Assessment & Plan Note (Signed)
Not well controlled. PHQ-9 score of 21 today (no SI) and patient sobbing during encounter. Could be dementia related behavioral disturbance but this is less likely. Refer for neuropsych testing as above. Continue venlafaxine 75mg  daily for now.

## 2022-02-02 NOTE — Chronic Care Management (AMB) (Signed)
  Care Coordination  Outreach Note  02/02/2022 Name: Nancy Gilbert MRN: [id number] DOB: [birth year]   Care Coordination Outreach Attempts: A telephone outreach was attempted today to offer the patient information about available care coordination services as a benefit of their health plan.   Follow Up Plan:  Additional outreach attempts will be made to offer the patient care coordination information and services.   Encounter Outcome:  Pt. Request to Call Back to daughter.   [place]  Direct Dial: [phone]

## 2022-02-02 NOTE — Assessment & Plan Note (Addendum)
Diagnosed in 2019 based on MoCA-Basic (Spanish) of 14/30. Remains on Aricept. Had unremarkable brain MRI this month (partially empty sella, whole-brain volume 55th percentile, hippocampal volume 72nd percentile). Curiously, patient is very aware of her memory challenges and they haven't seemed to progress significantly since 2019. Difficult to tease out whether her depression is a result of her memory changes or vice versa. Diagnosis is not entirely clear at this point. Will refer for neuropsych testing and neurology evaluation.

## 2022-02-07 NOTE — Chronic Care Management (AMB) (Signed)
  Care Coordination  Outreach Note  02/07/2022 Name: Leelah Hanna MRN: [id number] DOB: Aug 24, 1960   Care Coordination Outreach Attempts: A second unsuccessful outreach was attempted today to offer the patient with information about available care coordination services as a benefit of their health plan.     Follow Up Plan:  Additional outreach attempts will be made to offer the patient care coordination information and services.   Encounter Outcome:  Pt. Request to Call [place]  Direct Dial: [phone]

## 2022-02-13 NOTE — Chronic Care Management (AMB) (Signed)
  Care Coordination  Outreach Note  02/13/2022 Name: Nancy Gilbert MRN: [id number] DOB: May 31, 1960   Care Coordination Outreach Attempts: A third unsuccessful outreach was attempted today to offer the patient with information about available care coordination services as a benefit of their health plan.   Follow Up Plan:  No further outreach attempts will be made at this time. We have been unable to contact the patient to offer or enroll patient in care coordination services  Encounter Outcome:  No Answer  [place]: [phone]

## 2022-03-01 ENCOUNTER — Ambulatory Visit
Admission: RE | Admit: 2022-03-01 | Discharge: 2022-03-01 | Disposition: A | Discharge status: Home / Self Care | Payer: Medicaid Other | Source: Ambulatory Visit | Attending: Family Medicine | Admitting: Family Medicine

## 2022-03-01 DIAGNOSIS — Z1231 Encounter for screening mammogram for malignant neoplasm of breast: Secondary | ICD-10-CM

## 2022-04-04 ENCOUNTER — Other Ambulatory Visit: Payer: Self-pay | Admitting: Family Medicine

## 2022-04-04 DIAGNOSIS — I10 Essential (primary) hypertension: Secondary | ICD-10-CM

## 2022-04-04 DIAGNOSIS — F0280 Dementia in other diseases classified elsewhere without behavioral disturbance: Secondary | ICD-10-CM

## 2022-04-04 DIAGNOSIS — E785 Hyperlipidemia, unspecified: Secondary | ICD-10-CM

## 2022-04-11 ENCOUNTER — Telehealth: Payer: Self-pay | Admitting: Family Medicine

## 2022-04-11 NOTE — Telephone Encounter (Signed)
Nancy Gilbert (Pt's son-in-law) dropped off form at front desk for immigration.  Verified that patient section of form has been completed.  Last DOS with PCP was 01/31/2022.  Placed form in [place] team folder to be completed by clinical staff.  [name]

## 2022-04-13 ENCOUNTER — Encounter: Payer: Self-pay | Admitting: Physician Assistant

## 2022-04-13 NOTE — Telephone Encounter (Signed)
Clinical info completed on immigration form.  Placed form in Dr Megan Mans box for completion.    When form is completed, please route note to "RN Team" and place in wall pocket in front office.   Sunday Spillers, CMA

## 2022-04-13 NOTE — Telephone Encounter (Signed)
[phone] form unable to be completed by PCP.  Message sent to Dr. Miquel Dunn and Dr. Manson Passey for next steps.   Maury Dus, MD PGY-3, [facility]

## 2022-04-20 ENCOUNTER — Ambulatory Visit: Payer: Medicaid Other | Admitting: Physician Assistant

## 2022-04-20 ENCOUNTER — Encounter: Payer: Self-pay | Admitting: Physician Assistant

## 2022-04-20 VITALS — BP 143/90 | HR 76 | Resp 18 | Ht 62.0 in | Wt 164.0 lb

## 2022-04-20 DIAGNOSIS — R413 Other amnesia: Secondary | ICD-10-CM | POA: Diagnosis not present

## 2022-04-20 NOTE — Patient Instructions (Addendum)
It was a pleasure to see you today at our office.   Recommendations:  Follow up in  3 months Continue donepezil 5 mg daily. Side effects were discussed  Por favor reconecte con su psiquiatra   Whom to call:  Memory  decline, memory medications: Call our office [phone]   For psychiatric meds, mood meds: Please have your primary care physician manage these medications.   Counseling regarding caregiver distress, including caregiver depression, anxiety and issues regarding community resources, adult day care programs, adult living facilities, or memory care questions:   Feel free to contact Misty Lisabeth Register, Social Worker at [phone]   For assessment of decision of mental capacity and competency:  Call Dr. Erick Blinks, geriatric psychiatrist at [phone]  For guidance in geriatric dementia issues please call [facility] [phone]  For guidance regarding [facility] and if placement were needed at the facility, contact Sidney Ace, Social Worker tel: [phone]  If you have any severe symptoms of a stroke, or other severe issues such as confusion,severe chills or fever, etc call 911 or go to the ER as you may need to be evaluated further   Feel free to visit Facebook page " [email or web address]" for tips of how to care for people with memory problems.         RECOMMENDATIONS FOR ALL PATIENTS WITH MEMORY PROBLEMS: 1. Continue to exercise (Recommend 30 minutes of walking everyday, or 3 hours every week) 2. Increase social interactions - continue going to [place] and enjoy social gatherings with friends and family 3. Eat healthy, avoid fried foods and eat more fruits and vegetables 4. Maintain adequate blood pressure, blood sugar, and blood cholesterol level. Reducing the risk of stroke and cardiovascular disease also helps promoting better memory. 5. Avoid stressful situations. Live a simple life and avoid aggravations. Organize your time and  prepare for the next day in anticipation. 6. Sleep well, avoid any interruptions of sleep and avoid any distractions in the bedroom that may interfere with adequate sleep quality 7. Avoid sugar, avoid sweets as there is a strong link between excessive sugar intake, diabetes, and cognitive impairment We discussed the Mediterranean diet, which has been shown to help patients reduce the risk of progressive memory disorders and reduces cardiovascular risk. This includes eating fish, eat fruits and green leafy vegetables, nuts like almonds and hazelnuts, walnuts, and also use olive oil. Avoid fast foods and fried foods as much as possible. Avoid sweets and sugar as sugar use has been linked to worsening of memory function.  There is always a concern of gradual progression of memory problems. If this is the case, then we may need to adjust level of care according to patient needs. Support, both to the patient and caregiver, should then be put into place.    The Alzheimer's Association is here all day, every day for people facing Alzheimer's disease through our free 24/7 Helpline: [phone]. The Helpline provides reliable information and support to all those who need assistance, such as individuals living with memory loss, Alzheimer's or other dementia, caregivers, health care professionals and the public.  Our highly trained and knowledgeable staff can help you with: Understanding memory loss, dementia and Alzheimer's  Medications and other treatment options  General information about aging and brain health  Skills to provide quality care and to find the best care from professionals  Legal, financial and living-arrangement decisions Our Helpline also features: Confidential care consultation provided by master's level clinicians who can help  with decision-making support, crisis assistance and education on issues families face every day  Help in a caller's preferred language using our translation service  that features more than 200 languages and dialects  Referrals to local community programs, services and ongoing support     FALL PRECAUTIONS: Be cautious when walking. Scan the area for obstacles that may increase the risk of trips and falls. When getting up in the mornings, sit up at the edge of the bed for a few minutes before getting out of bed. Consider elevating the bed at the head end to avoid drop of blood pressure when getting up. Walk always in a well-lit room (use night lights in the walls). Avoid area rugs or power cords from appliances in the middle of the walkways. Use a walker or a cane if necessary and consider physical therapy for balance exercise. Get your eyesight checked regularly.  FINANCIAL OVERSIGHT: Supervision, especially oversight when making financial decisions or transactions is also recommended.  HOME SAFETY: Consider the safety of the kitchen when operating appliances like stoves, microwave oven, and blender. Consider having supervision and share cooking responsibilities until no longer able to participate in those. Accidents with firearms and other hazards in the house should be identified and addressed as well.   ABILITY TO BE LEFT ALONE: If patient is unable to contact 911 operator, consider using LifeLine, or when the need is there, arrange for someone to stay with patients. Smoking is a fire hazard, consider supervision or cessation. Risk of wandering should be assessed by caregiver and if detected at any point, supervision and safe proof recommendations should be instituted.  MEDICATION SUPERVISION: Inability to self-administer medication needs to be constantly addressed. Implement a mechanism to ensure safe administration of the medications.   DRIVING: Regarding driving, in patients with progressive memory problems, driving will be impaired. We advise to have someone else do the driving if trouble finding directions or if minor accidents are reported. Independent  driving assessment is available to determine safety of driving.   If you are interested in the driving assessment, you can contact the following:  [employer] in [place] [phone]  [facility] [phone]  [facility] [phone] or [phone]      Mediterranean Diet A Mediterranean diet refers to food and lifestyle choices that are based on the traditions of countries located on the [employer]. This way of eating has been shown to help prevent certain conditions and improve outcomes for people who have chronic diseases, like kidney disease and heart disease. What are tips for following this plan? Lifestyle  Cook and eat meals together with your family, when possible. Drink enough fluid to keep your urine clear or pale yellow. Be physically active every day. This includes: Aerobic exercise like running or swimming. Leisure activities like gardening, walking, or housework. Get 7-8 hours of sleep each night. If recommended by your health care provider, drink red wine in moderation. This means 1 glass a day for nonpregnant women and 2 glasses a day for men. A glass of wine equals 5 oz (150 mL). Reading food labels  Check the serving size of packaged foods. For foods such as rice and pasta, the serving size refers to the amount of cooked product, not dry. Check the total fat in packaged foods. Avoid foods that have saturated fat or trans fats. Check the ingredients list for added sugars, such as corn syrup. Shopping  At the grocery store, buy most  of your food from the areas near the walls of the store. This includes: Fresh fruits and vegetables (produce). Grains, beans, nuts, and seeds. Some of these may be available in unpackaged forms or large amounts (in bulk). Fresh seafood. Poultry and eggs. Low-fat dairy products. Buy whole ingredients instead of prepackaged foods. Buy fresh fruits and  vegetables in-season from local farmers markets. Buy frozen fruits and vegetables in resealable bags. If you do not have access to quality fresh seafood, buy precooked frozen shrimp or canned fish, such as tuna, salmon, or sardines. Buy small amounts of raw or cooked vegetables, salads, or olives from the deli or salad bar at your store. Stock your pantry so you always have certain foods on hand, such as olive oil, canned tuna, canned tomatoes, rice, pasta, and beans. Cooking  Cook foods with extra-virgin olive oil instead of using butter or other vegetable oils. Have meat as a side dish, and have vegetables or grains as your main dish. This means having meat in small portions or adding small amounts of meat to foods like pasta or stew. Use beans or vegetables instead of meat in common dishes like chili or lasagna. Experiment with different cooking methods. Try roasting or broiling vegetables instead of steaming or sauteing them. Add frozen vegetables to soups, stews, pasta, or rice. Add nuts or seeds for added healthy fat at each meal. You can add these to yogurt, salads, or vegetable dishes. Marinate fish or vegetables using olive oil, lemon juice, garlic, and fresh herbs. Meal planning  Plan to eat 1 vegetarian meal one day each week. Try to work up to 2 vegetarian meals, if possible. Eat seafood 2 or more times a week. Have healthy snacks readily available, such as: Vegetable sticks with hummus. Greek yogurt. Fruit and nut trail mix. Eat balanced meals throughout the week. This includes: Fruit: 2-3 servings a day Vegetables: 4-5 servings a day Low-fat dairy: 2 servings a day Fish, poultry, or lean meat: 1 serving a day Beans and legumes: 2 or more servings a week Nuts and seeds: 1-2 servings a day Whole grains: 6-8 servings a day Extra-virgin olive oil: 3-4 servings a day Limit red meat and sweets to only a few servings a month What are my food choices? Mediterranean  diet Recommended Grains: Whole-grain pasta. Brown rice. Bulgar wheat. Polenta. Couscous. Whole-wheat bread. Orpah Cobb. Vegetables: Artichokes. Beets. Broccoli. Cabbage. Carrots. Eggplant. Green beans. Chard. Kale. Spinach. Onions. Leeks. Peas. Squash. Tomatoes. Peppers. Radishes. Fruits: Apples. Apricots. Avocado. Berries. Bananas. Cherries. Dates. Figs. Grapes. Lemons. Melon. Oranges. Peaches. Plums. Pomegranate. Meats and other protein foods: Beans. Almonds. Sunflower seeds. Pine nuts. Peanuts. Cod. Salmon. Scallops. Shrimp. Tuna. Tilapia. Clams. Oysters. Eggs. Dairy: Low-fat milk. Cheese. Greek yogurt. Beverages: Water. Red wine. Herbal tea. Fats and oils: Extra virgin olive oil. Avocado oil. Grape seed oil. Sweets and desserts: [place] yogurt with honey. Baked apples. Poached pears. Trail mix. Seasoning and other foods: Basil. Cilantro. Coriander. Cumin. Mint. Parsley. Sage. Rosemary. Tarragon. Garlic. Oregano. Thyme. Pepper. Balsalmic vinegar. Tahini. Hummus. Tomato sauce. Olives. Mushrooms. Limit these Grains: Prepackaged pasta or rice dishes. Prepackaged cereal with added sugar. Vegetables: Deep fried potatoes (french fries). Fruits: Fruit canned in syrup. Meats and other protein foods: Beef. Pork. Lamb. Poultry with skin. Hot dogs. Tomasa Blase. Dairy: Ice cream. Sour cream. Whole milk. Beverages: Juice. Sugar-sweetened soft drinks. Beer. Liquor and spirits. Fats and oils: Butter. Canola oil. Vegetable oil. Beef fat (tallow). Lard. Sweets and desserts: Cookies. Cakes. Pies. Candy. Seasoning and other  foods: Mayonnaise. Premade sauces and marinades. The items listed may not be a complete list. Talk with your dietitian about what dietary choices are right for you. Summary The Mediterranean diet includes both food and lifestyle choices. Eat a variety of fresh fruits and vegetables, beans, nuts, seeds, and whole grains. Limit the amount of red meat and sweets that you eat. Talk with your  health care provider about whether it is safe for you to drink red wine in moderation. This means 1 glass a day for nonpregnant women and 2 glasses a day for men. A glass of wine equals 5 oz (150 mL). This information is not intended to replace advice given to you by your health care provider. Make sure you discuss any questions you have with your health care provider. Document Released: 12/22/2015 Document Revised: 01/24/2016 Document Reviewed: 12/22/2015 Elsevier Interactive Patient Education  2017 [employer]

## 2022-04-20 NOTE — Progress Notes (Cosign Needed Addendum)
Assessment/Plan:    The patient is seen in neurologic consultation at the request of Nancy Chandler, MD for the evaluation of memory.  Nancy Gilbert is a very pleasant 61 y.o. year old RH female with  a history of hypertension, hyperlipidemia, anxiety, depression seen today for evaluation of memory loss. MoCA in Spanish was attempted, but patient became tearful and began sobbing immediately after asking her date of birth.  Their assessment was unable to be performed.  Apparently, the patient's husband reports that some memory assessment may have been performed in [place], including LP for diagnosis of Alzheimer's disease.  This information is not available at the time of evaluation, although he has been instructed to see if there are any records that can be obtained and sent to [place] for review.  He will be requesting those, as for now, it is difficult to diagnose her memory impairment or its etiology.  In the past, she has been seen by psychiatry, and has been instructed to resume the sessions, in view of major depression, which could contribute to her memory issues.  On donepezil 5 mg daily as per PCP, tolerating it well.  Recommendations are as follows:  Memory impairment  Continue donepezil 5 mg daily as per PCP Continue to control mood as per PCP, continue Effexor Recommend  reconnect psychiatry evaluation of major depression Continue to control cardiovascular risk factors Obtain records from [place], which could have included LP for formal diagnosis of Alzheimer's disease versus other etiology.  A few days are not available or do not yield this diagnosis, in view of her early onset, she may need to have an LP performed here. Folllow up  in 3 months   Subjective:    The patient is accompanied by  who supplements the history.    How long did patient have memory difficulties?  "At least since 2019, when she began to forget words, forgetting the date or  forgetting the names of family members. Since then her husband reports worsening of symptoms ".  Apparently he had some workup done in the [place], but he cannot recall the details of the work done there. repeats oneself?  Endorsed "many times over and over " Disoriented when walking into a room?  Endorsed, even in her own home there are times she has a hard time recognizing it  Leaving objects in unusual places?  Endorsed, frequently she forgets where she placed things."She just stands there and has no idea what she is going to get" If I give her money, she takes  and then I cannot find it es it and places them in several unknown places" Ambulates  with difficulty?  " Very fast" Recent falls?  Patient denies   Any head injuries?  Only 1 time, 73 y ago when pregnant, fell of the bed and hit her head  History of seizures?   Patient denies   Wandering behavior? Endorsed. She went naked to the backyard a couple of times, last time about 3 months ago.  1 time she wandered off and neighbor has to bring her back. Patient drives?   She never drove Any personality changes? "A lot!" Before she was a normal person, but the last 5 years everything fell apart, lately, "over the last 1.5 years she is reaching the last drop ".  "She gets mad all the time, she talks too much and then begins to sob " Any history of depression?:  Endorsed, "she cries  frequently, feels very down.  They death of her mom 2 y ago affected her even more "-husband says Hallucinations or paranoia?  "When she sleeps she spends most of the time talking all night long ""with vivid dreams and REM behavior, no  sleepwalking "all night long, she dreams about her mom and sees her. Last week she was talking to her aunt and  uncle  History of sleep apnea?  Patient denies   Any hygiene concerns?  Endorsed, he "has to fight her to bathe and to change clothes ".   Independent of bathing and dressing?  Endorsed  Does the patient needs help  with medications?  Husband in charge  Who is in charge of the finances?  Husband is in charge   Any changes in appetite?  "Sometimes I eat sometimes I do not " Patient have trouble swallowing? Patient denies   Does the patient cook? Any kitchen accidents such as leaving the stove on?  Patient denies   Any headaches?  Patient denies   Double vision? Patient denies   Any focal numbness or tingling?  She has right arm arthritis, and this is being followed by orthopedics.  She has limited mobility on that arm. Chronic pain Patient denies   Stroke like symptoms?  Denies Any tremors?  Patient denies   Any history of anosmia?  Patient denies   Any incontinence of urine?  Patient denies   Any bowel dysfunction?   Patient denies    History of heavy alcohol intake?  Patient denies   History of heavy tobacco use?  Patient denies   Family history of dementia?  Mother had Alzheimer's disease diagnosed 25 y o   Patient lives: With her husband  MRI of the brain personally reviewed, 01/25/2022 is remarkable for unchanged partially empty sella, otherwise normal MRI of the brain without significant parenchymal volume loss or underlying chronic white matter disease.  An neuro quantitative volumetric analysis of the brain showed,  shows partially empty sella, whole brain volume at the 55 percentile, hypocampal volume at 72 percentile.  Labs August 2023 TSH 1.5, B12 938, lipid profile elevated, with TC 235, TG 344, HDL 37, VLDL 62, LDL 136,  No Known Allergies  Current Outpatient Medications  Medication Instructions   acetaminophen (TYLENOL) 500 mg, Oral, Every 6 hours PRN   atorvastatin (LIPITOR) 40 MG tablet Take 1 tablet by mouth once daily   budesonide-formoterol (SYMBICORT) 80-4.5 MCG/ACT inhaler 2 puffs, Inhalation, 2 times daily   donepezil (ARICEPT) 5 mg, Oral, Daily at bedtime   enalapril-hydrochlorothiazide (VASERETIC) 10-25 MG tablet 1 tablet, Oral, Daily   gabapentin (NEURONTIN) 300 mg, Oral, 2  times daily   Lidocaine (HM LIDOCAINE PATCH) 4 % PTCH Apply 1 patch daily as needed for pain relief   Melatonin 1 mg, Oral, Nightly   metoprolol succinate (TOPROL-XL) 50 MG 24 hr tablet TAKE 1 TABLET BY MOUTH AT BEDTIME. TAKE WITH OR IMMEDIATELY FOLLOWING A MEAL.   venlafaxine XR (EFFEXOR-XR) 75 MG 24 hr capsule TAKE 2 CAPSULES BY MOUTH ONCE DAILY WITH BREAKFAST.   VENTOLIN HFA 108 (90 Base) MCG/ACT inhaler 1-2 puffs, Inhalation, Every 4 hours PRN     VITALS:  There were no vitals filed for this visit.    01/31/2022    9:17 AM 01/02/2022    9:30 AM 03/15/2021    3:15 PM 12/21/2020    2:38 PM 12/31/2019   12:05 PM  Depression screen PHQ 2/9  Decreased Interest 3 2 3 2  0  Down, Depressed, Hopeless 3 2 3 2  0  PHQ - 2 Score 6 4 6 4  0  Altered sleeping 3 3 2 3  0  Tired, decreased energy 3 3 3 3  0  Change in appetite 3 2 1 1  0  Feeling bad or failure about yourself  0 1 2 0 0  Trouble concentrating 3 3 3 3  0  Moving slowly or fidgety/restless 3 3 3 1  0  Suicidal thoughts 0 0 3 0 0  PHQ-9 Score 21 19 23 15  0    PHYSICAL EXAM   HEENT:  Normocephalic, atraumatic. The mucous membranes are moist. The superficial temporal arteries are without ropiness or tenderness. Cardiovascular: Regular rate and rhythm. Lungs: Clear to auscultation bilaterally. Neck: There are no carotid bruits noted bilaterally.  NEUROLOGICAL:    04/04/2018    8:43 AM 04/03/2018    8:49 AM  Montreal Cognitive Assessment   Visuospatial/ Executive (0/5) 2 2  Naming (0/3) 2 2  Attention: Read list of digits (0/2) 1 1  Attention: Read list of letters (0/1) 0 0  Attention: Serial 7 subtraction starting at 100 (0/3) 0 0  Language: Repeat phrase (0/2) 0 0  Language : Fluency (0/1) 0 0  Abstraction (0/2) 1 1  Delayed Recall (0/5) 2 2  Orientation (0/6) 6 6  Total 14 14  Adjusted Score (based on education) 14 14        No data to display           Orientation:  Alert and oriented to person does not  participate in responding when asked to place or time.  Very anxious appearing, constantly sobbing.  She began to solve the uncontrollably when asked her date of birth, unable to perform MMSE. No aphasia or dysarthria. Fund of knowledge, memory when unable to be performed.  Unable to assess attention and concentration, ability to name objects or repeat phrases . Cranial nerves: There is good facial symmetry. Extraocular muscles are intact and visual fields are full to confrontational testing. Speech is very limited.  Soft palate rises symmetrically and there is no tongue deviation. Hearing is intact to conversational tone. Tone: Tone is good throughout. Sensation: Sensation is intact to light touch and pinprick throughout. Vibration is intact at the bilateral big toe.There is no extinction with double simultaneous stimulation. There is no sensory dermatomal level identified. Coordination: The patient has difficulty with RAM's or FNF bilaterally. Normal finger to nose.  She has some limited range of motion on the right shoulder. Motor: Strength is 5/5 in the bilateral upper and lower extremities. There is no pronator drift. There are no fasciculations noted. DTR's: Deep tendon reflexes are 2/4 at the bilateral biceps, triceps, brachioradialis, patella and achilles.  Gait and Station: The patient is able to ambulate without difficulty.The patient is able to ambulate in a tandem fashion, able to stand in the Romberg position.     Thank you for allowing the opportunity to participate in the care of this nice patient. Please do not hesitate to contact for any questions or concerns.   Total time spent on today's visit was 52 minutes dedicated to this patient today, preparing to see patient, examining the patient, ordering tests and/or medications and counseling the patient, documenting clinical information in the EHR or other health record, independently interpreting results and communicating results to  the patient/family, discussing treatment and goals, answering patient's questions and coordinating care.  Cc:  , MD  04/20/2022 7:27  AM

## 2022-04-23 ENCOUNTER — Ambulatory Visit: Payer: Medicaid Other | Admitting: Family Medicine

## 2022-04-23 ENCOUNTER — Encounter: Payer: Self-pay | Admitting: Family Medicine

## 2022-04-23 VITALS — BP 119/90 | HR 81 | Ht 62.0 in | Wt 166.6 lb

## 2022-04-23 DIAGNOSIS — M75101 Unspecified rotator cuff tear or rupture of right shoulder, not specified as traumatic: Secondary | ICD-10-CM

## 2022-04-23 DIAGNOSIS — F339 Major depressive disorder, recurrent, unspecified: Secondary | ICD-10-CM

## 2022-04-23 DIAGNOSIS — M25561 Pain in right knee: Secondary | ICD-10-CM

## 2022-04-23 DIAGNOSIS — G300 Alzheimer's disease with early onset: Secondary | ICD-10-CM

## 2022-04-23 DIAGNOSIS — F0280 Dementia in other diseases classified elsewhere without behavioral disturbance: Secondary | ICD-10-CM | POA: Diagnosis not present

## 2022-04-23 DIAGNOSIS — F3289 Other specified depressive episodes: Secondary | ICD-10-CM

## 2022-04-23 MED ORDER — LIDOCAINE 4 % EX PTCH: MEDICATED_PATCH | CUTANEOUS | 0 refills | Status: DC

## 2022-04-23 NOTE — Progress Notes (Unsigned)
    SUBJECTIVE:   CHIEF COMPLAINT / HPI:   Memory/Mood Follow Up Patient last seen 01/31/22 for mood and memory concerns. Referred to neuropsych-- saw them on 04/20/22 No records from DR regarding this.  Right Arm Upper arm hurts, 4 months No injury Known right rotator cuff tear for several years Doesn't take anything for pain. Uses menthol without much relief. Unsure if she takes any oral meds.  L index finger For 1 year, asymptomatic   PERTINENT  PMH / PSH: ***  OBJECTIVE:   BP (!) 119/90   Pulse 81   Ht 5\' 2"  (1.575 m)   Wt 166 lb 9.6 oz (75.6 kg)   SpO2 100%   BMI 30.47 kg/m   ***  ASSESSMENT/PLAN:   No problem-specific Assessment & Plan notes found for this encounter.     , MD [facility]

## 2022-04-23 NOTE — Patient Instructions (Addendum)
It was great to see you!  It's very difficult to tell how much of your symptoms are related to Alzheimer's (memory issues) and how much is related to depression.   We will try a new medication to see if this helps with your depression at all. I will send it to your pharmacy by the end of the day tomorrow. See me back in 6 weeks to check in. We will schedule you for a visit with our geriatric specialist early next year. Our office will reach out to you about scheduling this appointment. For your arm pain, you can take Tylenol 650mg  every 6 hours as needed. I also refilled your lidocaine patch which you can use daily for the pain.  Bring your green card and any other IDs to your appointment tomorrow.  -Dr 

## 2022-04-24 ENCOUNTER — Ambulatory Visit (INDEPENDENT_AMBULATORY_CARE_PROVIDER_SITE_OTHER): Payer: Medicaid Other | Admitting: Family Medicine

## 2022-04-24 VITALS — BP 116/77 | HR 85 | Wt 165.0 lb

## 2022-04-24 DIAGNOSIS — G300 Alzheimer's disease with early onset: Secondary | ICD-10-CM

## 2022-04-24 DIAGNOSIS — F0280 Dementia in other diseases classified elsewhere without behavioral disturbance: Secondary | ICD-10-CM | POA: Diagnosis not present

## 2022-04-24 MED ORDER — BUPROPION HCL ER (XL) 150 MG PO TB24: 150.0000 mg | ORAL_TABLET | Freq: Every day | ORAL | 2 refills | Status: DC

## 2022-04-24 NOTE — Assessment & Plan Note (Signed)
Once again difficult to know how much of this is Alzheimer's with behavior change vs underlying depression. For now we will adjust depression medication as below and see if there is any change. Continue Aricept 5mg  daily. Will schedule patient in geri clinic with Dr McDiarmid for additional evaluation. Would consider Neudexta or Seroquel if no improvement with wellbutrin below.

## 2022-04-24 NOTE — Patient Instructions (Addendum)
It was wonderful to see you today.  Please bring ALL of your medications with you to every visit.   Today we talked about:  - I will complete your [phone] form  - You will need to send it in with the N400  I will call you about the form   Please follow up in 1 months   Hoy hablamos de: - Completar su formulario N648 - Tendrs que enviarlo con el N400. Te llamar sobre el formulario. Por favor haga un seguimiento en 1 mes.   Thank you for choosing [facility].   Please call [phone] with any questions about today's appointment.  Please be sure to schedule follow up at the front  desk before you leave today.   Terisa Starr, MD  Family Medicine

## 2022-04-24 NOTE — Assessment & Plan Note (Signed)
Diagnosed here in 2019 Has had imaging which was reviewed Reports prior LP in DR  Has been seen by Dr. Vanetta Shawl who has suggested most likely type of early onset dementia Alzheimer's type Question of pseudobulbar affect Would likely benefit from seeing Psychiatry again  Will complete [phone] given her significant impairment from early onset memory loss

## 2022-04-24 NOTE — Assessment & Plan Note (Signed)
Not well-controlled. As above, difficult to know whether this is true MDD vs sequelae of her memory disorder.  -Continue venlafaxine 150mg  daily -Add bupropion 150mg  daily

## 2022-04-24 NOTE — Progress Notes (Signed)
  Patient Name: Nancy Gilbert Date of Birth: October 13, 1960 Date of Visit: 04/24/22 PCP: Maury Dus, MD  Chief Complaint: form completion  Waldemar Dickens- works for [employer]  Subjective: Nancy Gilbert is a pleasant 61 y.o. with medical history significant for early onset Alzheimer's dementia,  presenting today for completion of N-648 form.   The patient speaks Spanish as their primary language.  An interpreter was used for the entire visit.  Earle Gell- In Person   The purpose of this visit was explained with to the patient and family members. The patient was interviewed alone.   Identification confirmed and documented on N-648.   Has a history of early onset Alzheimer's dementia. Over the past few years, she has had uncontrollable outbursts of crying. She is here today with her grandson and son in law. Most of the visit is alone. The patient can complete most ADLs by herself but needs help with all instrumental ADL. She cannot grocery shop because she forgets what she is doing. She cannot manage finances even if she has money. She does not drive and cannot take the bus as she gets lost. She has a cell phone but does not know how to use this.   Independent with ADL Function:  Ambulating: Yes Feeding:Yes Bathing:Yes Dressing:Yes Toileting: Yes Transferring: Yes  Independent with Instrumental ADL Function   Finances:No Transportation: No Meal preparation: No Household chores: No Communication with others: No Medications: No   PMH:   Early onset dementia, suspect Alzheimer's type, has prior LP in [place]  Migraines Chronic R shoulder pain  No history of illicit substance use or alcohol use    PSH: Tubal ligation   Social History: Lives with family- daughter, son in law     ROS: Per HPI.   I have reviewed the patient's medical, surgical, family, and social history as appropriate.  Vitals:   04/24/22 1353  BP: 116/77  Pulse: 85  SpO2: 100%   At first  appropriate and talkative Cardiac: Warm well perfused.  Capillary refill less than 3 seconds Respiratory breathing comfortably on room air Psych: Pleasant normal affect, appropriate, normal rate of speech During exam with memory testing, patient began crying uncontrollably similar to prior exams   Alzheimer's dementia (HCC) Diagnosed here in 2019 Has had imaging which was reviewed Reports prior LP in [place]  Has been seen by Dr. Vanetta Shawl who has suggested most likely type of early onset dementia Alzheimer's type Question of pseudobulbar affect Would likely benefit from seeing Psychiatry again  Will complete [phone] given her significant impairment from early onset memory loss     Patient signed [id number] Interpreter signed [id number]    Terisa Starr, MD  Family Medicine Teaching Service

## 2022-04-24 NOTE — Assessment & Plan Note (Signed)
Chronic R shoulder pain likely related to known rotator cuff pathology. -Encouraged Tylenol prn -Rx sent for lidocaine patches

## 2022-05-16 ENCOUNTER — Telehealth: Payer: Self-pay | Admitting: Family Medicine

## 2022-05-16 NOTE — Telephone Encounter (Signed)
Patient's daughter called and informed that forms are ready for pick up. Copy made and placed in batch scanning. Original placed at front desk for pick up.   Guled Gahan C Yuto Cajuste, RN  

## 2022-05-16 NOTE — Telephone Encounter (Signed)
F681 completed. Reviewed, completed, and signed form.  Note routed to RN team inbasket and placed completed form in RN Wall pocket in the front office.  Martyn Malay, MD

## 2022-06-14 ENCOUNTER — Ambulatory Visit: Payer: Medicaid Other

## 2022-06-20 ENCOUNTER — Other Ambulatory Visit: Payer: Self-pay | Admitting: Family Medicine

## 2022-06-20 DIAGNOSIS — I10 Essential (primary) hypertension: Secondary | ICD-10-CM

## 2022-07-27 ENCOUNTER — Ambulatory Visit: Payer: Medicaid Other | Admitting: Physician Assistant

## 2022-07-27 ENCOUNTER — Encounter: Payer: Self-pay | Admitting: Physician Assistant

## 2022-07-30 ENCOUNTER — Other Ambulatory Visit: Payer: Self-pay | Admitting: Family Medicine

## 2022-07-30 DIAGNOSIS — F0280 Dementia in other diseases classified elsewhere without behavioral disturbance: Secondary | ICD-10-CM

## 2022-07-30 DIAGNOSIS — F3289 Other specified depressive episodes: Secondary | ICD-10-CM

## 2022-07-30 DIAGNOSIS — E785 Hyperlipidemia, unspecified: Secondary | ICD-10-CM

## 2022-09-11 ENCOUNTER — Other Ambulatory Visit: Payer: Self-pay | Admitting: Family Medicine

## 2022-09-11 DIAGNOSIS — I10 Essential (primary) hypertension: Secondary | ICD-10-CM

## 2022-09-11 DIAGNOSIS — E785 Hyperlipidemia, unspecified: Secondary | ICD-10-CM

## 2022-09-12 ENCOUNTER — Other Ambulatory Visit: Payer: Self-pay

## 2022-09-18 ENCOUNTER — Encounter: Payer: Self-pay | Admitting: Family Medicine

## 2022-09-18 ENCOUNTER — Ambulatory Visit: Payer: Medicaid Other | Admitting: Family Medicine

## 2022-09-18 VITALS — BP 116/82 | HR 79 | Ht 62.0 in | Wt 168.2 lb

## 2022-09-18 DIAGNOSIS — F0283 Dementia in other diseases classified elsewhere, unspecified severity, with mood disturbance: Secondary | ICD-10-CM

## 2022-09-18 DIAGNOSIS — F339 Major depressive disorder, recurrent, unspecified: Secondary | ICD-10-CM

## 2022-09-18 DIAGNOSIS — M25511 Pain in right shoulder: Secondary | ICD-10-CM | POA: Diagnosis not present

## 2022-09-18 DIAGNOSIS — F3289 Other specified depressive episodes: Secondary | ICD-10-CM

## 2022-09-18 DIAGNOSIS — G8929 Other chronic pain: Secondary | ICD-10-CM | POA: Diagnosis not present

## 2022-09-18 DIAGNOSIS — G300 Alzheimer's disease with early onset: Secondary | ICD-10-CM | POA: Diagnosis not present

## 2022-09-18 MED ORDER — BUPROPION HCL ER (XL) 150 MG PO TB24: 150.0000 mg | ORAL_TABLET | Freq: Every day | ORAL | 0 refills | Status: DC

## 2022-09-18 MED ORDER — GABAPENTIN 300 MG PO CAPS: 300.0000 mg | ORAL_CAPSULE | Freq: Every day | ORAL | 1 refills | Status: DC

## 2022-09-18 MED ORDER — VENLAFAXINE HCL ER 75 MG PO CP24
ORAL_CAPSULE | ORAL | 0 refills | Status: DC
Start: 2022-09-18 — End: 2023-04-05

## 2022-09-18 MED ORDER — MELOXICAM 15 MG PO TABS
15.0000 mg | ORAL_TABLET | Freq: Every day | ORAL | 0 refills | Status: DC
Start: 2022-09-18 — End: 2022-11-01

## 2022-09-18 NOTE — Progress Notes (Unsigned)
    SUBJECTIVE:   CHIEF COMPLAINT / HPI:   R Shoulder Pain -present for at least 5 months -constant -no particular aggravating factors that she identifies (based on exam seems to be worse with movement) -radiates down arm, especially at night -right hand dominant -not taking any medication for relief -previously prescribed gabapentin and lidocaine patch, not using these, unclear why -had right shoulder and c-spine x-rays in [facility] in February 2024 showing osteoarthritis and shoulder impingement -known history of rotator cuff tear on right (initially [place]) with resultant chronic R shoulder pain  Depression, Dementia -Patient with diagnoses of depression as well as early-onset dementia -Reports continued difficulty with low mood and husband notes frequent (daily) crying outbursts -Compliance with home venlafaxine and bupropion are unclear. Seems patient forgets her medications frequently. Husband tries to help but is not explicitly managing meds for her -Sometimes has passive SI, especially when she's in a lot of pain -No plan or intent -No hallucinations, aggression, or other behavioral issues  PERTINENT  PMH / PSH: HTN, HLD, remainder per HPI  OBJECTIVE:   BP 116/82   Pulse 79   Ht 5\' 2"  (1.575 m)   Wt 168 lb 3.2 oz (76.3 kg)   SpO2 99%   BMI 30.76 kg/m   Gen: NAD, able to participate in exam CV: RRR, normal S1/S2, no murmur Resp: Normal effort, lungs CTAB Extremities: no edema or cyanosis Skin: warm and dry, no rashes noted Neuro: alert, no obvious focal deficits, did not attempt orientation questions as this has previously caused her to begin sobbing uncontrollably Psych: flat affect R Shoulder: inspection unremarkable, generalized tenderness to palpation throughout entire shoulder, worst at [facility] joint, moderately decreased ROM in all directions secondary to pain, unable to abduct beyond ~75 degrees. Remainder of R arm exam unremarkable. NVI  distally. Neck: supple, no midline tenderness, FROM  ASSESSMENT/PLAN:   Chronic right shoulder pain Likely secondary to known rotator cuff pathology with resultant arthritis (rotator cuff tear diagnosed in [place], was told she may need surgery many years ago although didn't have). -Will have x-rays from [facility] formally translated -Meloxicam 15mg  daily x10 days -Resume gabapentin 300mg  at bedtime -Referral to physical therapy -Consider steroid injection and/or referral to sports med in the future  Dementia Diagnosed in 2019 based on MoCA-Basic (Spanish version) of 14/30. Seems to be significant overlap between her dementia and depression that is difficult to tease out. I wonder about pseudobulbar affect as she has previously burst out sobbing during encounters. Follow up with neuro (last seen 04/2022) for consideration of Neudexta. Pending their evaluation may need separate referral to psych as well.  MDD (major depressive disorder) Not well-controlled. As above, difficult to ascertain how much is straightforward MDD vs a mood-component of her dementia vs pseudobulbar affect. Unclear compliance with home meds. Emphasized importance of compliance with venlafaxine 150mg  daily and bupropion 150mg  daily. Refills sent. Follow up in 1 month.   Spanish interpreter present for duration of encounter.  Maury Dus, MD [facility]

## 2022-09-18 NOTE — Patient Instructions (Addendum)
  For your arm pain: -I sent meloxicam to your pharmacy.  Take this once daily for the next 10 days. -I also sent refills on your gabapentin.  Resume taking this.  1 tablet at bedtime -I ordered physical therapy.  Someone will contact you to schedule an appointment.  For your mood and memory: -Continue taking venlafaxine (Effexor) and bupropion.  I sent refills. -I would like you to see the neuro psych specialist again. Ask them about pseudobulbar affect and whether they think Neudexta would be appropriate for you. Call them to schedule an appointment [phone]   Take care, Dr Anner Crete

## 2022-09-19 DIAGNOSIS — G8929 Other chronic pain: Secondary | ICD-10-CM | POA: Insufficient documentation

## 2022-09-19 HISTORY — DX: Other chronic pain: G89.29

## 2022-09-19 NOTE — Assessment & Plan Note (Addendum)
Diagnosed in 2019 based on MoCA-Basic (Spanish version) of 14/30. Seems to be significant overlap between her dementia and depression that is difficult to tease out. I wonder about pseudobulbar affect as she has previously burst out sobbing during encounters. Follow up with neuro (last seen 04/2022) for consideration of Neudexta. Pending their evaluation may need separate referral to psych as well.

## 2022-09-19 NOTE — Assessment & Plan Note (Addendum)
Likely secondary to known rotator cuff pathology with resultant arthritis (rotator cuff tear diagnosed in [place], was told she may need surgery many years ago although didn't have). -Will have x-rays from [facility] formally translated -Meloxicam 15mg  daily x10 days -Resume gabapentin 300mg  at bedtime -Referral to physical therapy -Consider steroid injection and/or referral to sports med in the future

## 2022-09-19 NOTE — Assessment & Plan Note (Signed)
Not well-controlled. As above, difficult to ascertain how much is straightforward MDD vs a mood-component of her dementia vs pseudobulbar affect. Unclear compliance with home meds. Emphasized importance of compliance with venlafaxine 150mg  daily and bupropion 150mg  daily. Refills sent. Follow up in 1 month.

## 2022-10-02 ENCOUNTER — Other Ambulatory Visit: Payer: Self-pay | Admitting: Family Medicine

## 2022-10-02 DIAGNOSIS — I10 Essential (primary) hypertension: Secondary | ICD-10-CM

## 2022-10-31 ENCOUNTER — Other Ambulatory Visit: Payer: Self-pay | Admitting: Family Medicine

## 2022-10-31 DIAGNOSIS — I10 Essential (primary) hypertension: Secondary | ICD-10-CM

## 2022-11-16 ENCOUNTER — Other Ambulatory Visit: Payer: Self-pay

## 2022-11-16 DIAGNOSIS — J45909 Unspecified asthma, uncomplicated: Secondary | ICD-10-CM

## 2022-11-16 MED ORDER — VENTOLIN HFA 108 (90 BASE) MCG/ACT IN AERS
1.0000 | INHALATION_SPRAY | RESPIRATORY_TRACT | 4 refills | Status: DC | PRN
Start: 2022-11-16 — End: 2022-11-19

## 2022-11-19 ENCOUNTER — Ambulatory Visit (HOSPITAL_COMMUNITY)
Admission: RE | Admit: 2022-11-19 | Discharge: 2022-11-19 | Disposition: A | Discharge status: Home / Self Care | Payer: Medicaid Other | Source: Ambulatory Visit | Attending: Family Medicine | Admitting: Family Medicine

## 2022-11-19 ENCOUNTER — Ambulatory Visit: Payer: Medicaid Other | Admitting: Family Medicine

## 2022-11-19 VITALS — BP 105/73 | HR 78 | Ht 62.0 in | Wt 169.0 lb

## 2022-11-19 DIAGNOSIS — R002 Palpitations: Secondary | ICD-10-CM | POA: Diagnosis not present

## 2022-11-19 DIAGNOSIS — J45909 Unspecified asthma, uncomplicated: Secondary | ICD-10-CM | POA: Diagnosis not present

## 2022-11-19 DIAGNOSIS — I10 Essential (primary) hypertension: Secondary | ICD-10-CM

## 2022-11-19 MED ORDER — VENTOLIN HFA 108 (90 BASE) MCG/ACT IN AERS
1.0000 | INHALATION_SPRAY | RESPIRATORY_TRACT | 4 refills | Status: DC | PRN
Start: 2022-11-19 — End: 2023-11-21

## 2022-11-19 MED ORDER — METOPROLOL SUCCINATE ER 50 MG PO TB24
ORAL_TABLET | ORAL | 0 refills | Status: DC
Start: 2022-11-19 — End: 2023-01-29

## 2022-11-19 MED ORDER — BUDESONIDE-FORMOTEROL FUMARATE 80-4.5 MCG/ACT IN AERO
2.0000 | INHALATION_SPRAY | Freq: Two times a day (BID) | RESPIRATORY_TRACT | 0 refills | Status: DC
Start: 2022-11-19 — End: 2023-11-21

## 2022-11-19 NOTE — Patient Instructions (Addendum)
   For your heart we will be putting in a cardiology referral. Please make sure to keep taking your metoprolol medication. Do not abruptly stop this.   If you start to get light headed or the feeling gets worse please be seen in the Emergency room.

## 2022-11-21 DIAGNOSIS — R002 Palpitations: Secondary | ICD-10-CM | POA: Insufficient documentation

## 2022-11-21 HISTORY — DX: Palpitations: R00.2

## 2022-11-21 NOTE — Assessment & Plan Note (Addendum)
Palpitations or atypical chest pain could be due to not taking her metoprolol recently. EKG was normal without any arrhythmias or interval abnormalities.  - Follow up in two days  - ED precautions given to patient and grandson.

## 2022-11-21 NOTE — Progress Notes (Signed)
    SUBJECTIVE:   CHIEF COMPLAINT / HPI:   Asthma Medication refills  Patient has history of asthma. Has been on daily symbicort with occasional albuterol use. Needs refill of albuterol and symbicort.   Atypical Chest Pain/ Palpitations  Patient reports that for the past week or so patient has had atypical uneasy sensation. History is limited by language barrier. She reports that it feels like when you are surprised or scared and your heart "hiccups". It is a short episode of about a couple minutes. Happens with and without exertion. No other symptoms. Denies light headedness. Has memory issues and daughter is usually in charge of her medications; however, daughter has been out of town for the last couple weeks. Is unsure if she has been taking her metoprolol. Based on fill history has run out of it. Lucila Maine is unable to say if she has been using metoprolol.   PERTINENT  PMH / PSH: Atypical chest pain, Asthma   OBJECTIVE:   BP 105/73   Pulse 78   Ht 5\' 2"  (1.575 m)   Wt 169 lb (76.7 kg)   SpO2 100%   BMI 30.91 kg/m   General: well appearing, in no acute distress CV: RRR, radial pulses equal and palpable, no BLE edema  Resp: Normal work of breathing on room air, CTAB Abd: Soft, non tender, non distended  Neuro: Alert & Oriented x 4    ASSESSMENT/PLAN:   Palpitations Assessment & Plan: Palpitations or atypical chest pain could be due to not taking her metoprolol recently. EKG was normal without any arrhythmias or interval abnormalities.  - Follow up in two days  - ED precautions given to patient and grandson.   Orders: -     Metoprolol Succinate ER; Take with or immediately following a meal.  Dispense: 30 tablet; Refill: 0 -     Ambulatory referral to Cardiology -     EKG 12-Lead  Asthma, unspecified asthma severity, unspecified whether complicated, unspecified whether persistent -     Ventolin HFA; Inhale 1-2 puffs into the lungs every 4 (four) hours as needed for wheezing  or shortness of breath.  Dispense: 18 g; Refill: 4 -     Budesonide-Formoterol Fumarate; Inhale 2 puffs into the lungs 2 (two) times daily.  Dispense: 11 g; Refill: 0  Primary hypertension -     Metoprolol Succinate ER; Take with or immediately following a meal.  Dispense: 30 tablet; Refill: 0  Other orders -     EKG 12-Lead      Lockie Mola, MD [facility]

## 2022-12-04 ENCOUNTER — Ambulatory Visit: Payer: Self-pay | Admitting: Family Medicine

## 2022-12-04 NOTE — Progress Notes (Deleted)
  SUBJECTIVE:   CHIEF COMPLAINT / HPI:   Follow-up for palpitations, last seen 7/8 and had a normal EKG.  Advised to take metoprolol and referred to cardiology ***  PERTINENT  PMH / PSH: ***  Past Medical History:  Diagnosis Date   Blurred vision, bilateral 09/25/2016   Cognitive impairment 12/16/2017   Dyspnea 03/13/2016   Early onset dementia [facility]), uncertain nature 12/16/2017   Essential hypertension 2011   Hypertension    Migraines 2006   Overweight (BMI 25.0-29.9) 04/04/2018   Seasonal allergies 2014    OBJECTIVE:  There were no vitals taken for this visit.  General: NAD, pleasant, able to participate in exam Cardiac: RRR, no murmurs auscultated Respiratory: CTAB, normal WOB Abdomen: soft, non-tender, non-distended, normoactive bowel sounds Extremities: warm and well perfused, no edema or cyanosis Skin: warm and dry, no rashes noted Neuro: alert, no obvious focal deficits, speech normal Psych: Normal affect and mood  ASSESSMENT/PLAN:   There are no diagnoses linked to this encounter. No orders of the defined types were placed in this encounter.  No follow-ups on file.  Vonna Drafts, MD [facility]

## 2022-12-21 ENCOUNTER — Other Ambulatory Visit: Payer: Self-pay

## 2022-12-21 MED ORDER — BUPROPION HCL ER (XL) 150 MG PO TB24: 150.0000 mg | ORAL_TABLET | Freq: Every day | ORAL | 0 refills | Status: DC

## 2022-12-27 ENCOUNTER — Emergency Department (HOSPITAL_COMMUNITY)
Admission: EM | Admit: 2022-12-27 | Discharge: 2022-12-27 | Disposition: A | Discharge status: Home / Self Care | Payer: Medicaid Other | Attending: Emergency Medicine | Admitting: Emergency Medicine

## 2022-12-27 ENCOUNTER — Other Ambulatory Visit: Payer: Self-pay

## 2022-12-27 DIAGNOSIS — I10 Essential (primary) hypertension: Secondary | ICD-10-CM | POA: Diagnosis not present

## 2022-12-27 DIAGNOSIS — Z79899 Other long term (current) drug therapy: Secondary | ICD-10-CM | POA: Diagnosis not present

## 2022-12-27 DIAGNOSIS — R519 Headache, unspecified: Secondary | ICD-10-CM | POA: Diagnosis not present

## 2022-12-27 LAB — BASIC METABOLIC PANEL
Anion gap: 10 (ref 5–15)
BUN: 17 mg/dL (ref 8–23)
CO2: 25 mmol/L (ref 22–32)
Calcium: 9.3 mg/dL (ref 8.9–10.3)
Chloride: 102 mmol/L (ref 98–111)
Creatinine, Ser: 0.88 mg/dL (ref 0.44–1.00)
GFR, Estimated: >60 mL/min (ref >60)
Glucose, Bld: 112 mg/dL — ABNORMAL HIGH (ref 70–99)
Potassium: 3.8 mmol/L (ref 3.5–5.1)
Sodium: 137 mmol/L (ref 135–145)

## 2022-12-27 LAB — CBC
HCT: 39.8 % (ref 36.0–46.0)
Hemoglobin: 13.2 g/dL (ref 12.0–15.0)
MCH: 29.7 pg (ref 26.0–34.0)
MCHC: 33.2 g/dL (ref 30.0–36.0)
MCV: 89.4 fL (ref 80.0–100.0)
Platelets: 259 10*3/uL (ref 150–400)
RBC: 4.45 MIL/uL (ref 3.87–5.11)
RDW: 12.6 % (ref 11.5–15.5)
WBC: 8.3 10*3/uL (ref 4.0–10.5)
nRBC: 0 % (ref 0.0–0.2)

## 2022-12-27 MED ORDER — ENALAPRIL MALEATE 10 MG PO TABS
10.0000 mg | ORAL_TABLET | Freq: Once | ORAL | Status: AC
  Administered 2022-12-27: 10 mg via ORAL
  Filled 2022-12-27: qty 1

## 2022-12-27 MED ORDER — ENALAPRIL-HYDROCHLOROTHIAZIDE 10-25 MG PO TABS
1.0000 | ORAL_TABLET | Freq: Every day | ORAL | 0 refills | Status: DC
Start: 2022-12-27 — End: 2023-01-08

## 2022-12-27 MED ORDER — HYDROCHLOROTHIAZIDE 25 MG PO TABS
25.0000 mg | ORAL_TABLET | Freq: Every day | ORAL | Status: DC
  Administered 2022-12-27: 25 mg via ORAL
  Filled 2022-12-27: qty 1

## 2022-12-27 NOTE — Discharge Instructions (Addendum)
Take your blood pressure medication as directed.  Follow with your primary physician in the next 2 to 3 days.  Monitor for any signs worsen including increased headache, numbness, weakness, or changes in vision.  Return to the ED for any worsening symptoms or further concerns.

## 2022-12-27 NOTE — ED Triage Notes (Signed)
Patient reports headache and neck pain x 4 days. Has also run out of her Amlodipine and has been out for four days. Is taking garlic instead. States she has been twice to the pharmacy to get it but they do not have it.

## 2022-12-27 NOTE — ED Provider Notes (Signed)
EMERGENCY DEPARTMENT AT [facility] Provider Note   CSN: [id number] Arrival date & time: 12/27/22  8295     History  Chief Complaint  Patient presents with   Headache    Nancy Gilbert is a 62 y.o. female.   Patient is a 62 year old female with past medical history of hypertension, memory impairment, HLD, and chronic pain, presenting today for headache.  She states she believes it is related to running out of her blood pressure medication.  She states that she ran out of it 4 days ago and has had difficulty with getting it filled at the pharmacy.  Per the chart, she is typically on enalapril-HCTZ.  She admits to associated eye pain, worse on the left.  She denies any vision changes with this.  She has no eye redness or drainage.  She denies any fevers or chills.  She has no difficulty with movement of her neck.  She denies any focal numbness, tingling, or weakness.  She has had no falls or injuries.  She is not on blood thinners.  She states her headache began last night.  She denies doing anything particular when the pain started.  She has not taken any medications for this prior to arrival.  Home Medications Prior to Admission medications   Medication Sig Start Date End Date Taking? Authorizing Provider  atorvastatin (LIPITOR) 40 MG tablet Take 1 tablet by mouth once daily 09/13/22   Maury Dus, MD  budesonide-formoterol [facility]) 80-4.5 MCG/ACT inhaler Inhale 2 puffs into the lungs 2 (two) times daily. 11/19/22   Lockie Mola, MD  buPROPion (WELLBUTRIN XL) 150 MG 24 hr tablet Take 1 tablet (150 mg total) by mouth daily. 12/21/22   Vonna Drafts, MD  donepezil (ARICEPT) 5 MG tablet TAKE 1 TABLET BY MOUTH AT BEDTIME 07/31/22   Maury Dus, MD  enalapril-hydrochlorothiazide (VASERETIC) 10-25 MG tablet Take 1 tablet by mouth daily for 14 days. 12/27/22 01/10/23  , Luther Parody, DO  gabapentin (NEURONTIN) 300 MG capsule Take 1 capsule (300 mg total)  by mouth at bedtime. 09/18/22   Maury Dus, MD  lidocaine (HM LIDOCAINE PATCH) 4 % Apply 1 patch daily as needed for pain relief 04/23/22   Maury Dus, MD  Melatonin 1 MG CHEW Chew 1 mg by mouth at bedtime. 12/21/20   Autry-Lott, Randa Evens, DO  meloxicam (MOBIC) 15 MG tablet Take 1 tablet by mouth once daily 11/01/22   Maury Dus, MD  metoprolol succinate (TOPROL-XL) 50 MG 24 hr tablet Take with or immediately following a meal. 11/19/22   Lockie Mola, MD  venlafaxine XR (EFFEXOR-XR) 75 MG 24 hr capsule TAKE 2 CAPSULES BY MOUTH ONCE DAILY WITH BREAKFAST. 09/18/22   Maury Dus, MD  VENTOLIN HFA 108 (90 Base) MCG/ACT inhaler Inhale 1-2 puffs into the lungs every 4 (four) hours as needed for wheezing or shortness of breath. 11/19/22   Lockie Mola, MD      Allergies    Patient has no known allergies.    Review of Systems   Review of Systems Negative except for as noted above in the HPI  Physical Exam Updated Vital Signs BP (!) 140/92 (BP Location: Left Arm)   Pulse 68   Temp 98.1 F (36.7 C) (Oral)   Resp 15   SpO2 100%  Physical Exam Vitals and nursing note reviewed.  Constitutional:      General: She is not in acute distress.    Appearance: Normal appearance. She is well-developed.  HENT:  Head: Normocephalic and atraumatic.     Mouth/Throat:     Mouth: Mucous membranes are moist.  Eyes:     Extraocular Movements: Extraocular movements intact.     Conjunctiva/sclera: Conjunctivae normal.     Pupils: Pupils are equal, round, and reactive to light.  Neck:     Comments: No cervical spine tenderness. No nuchal rigidity Cardiovascular:     Rate and Rhythm: Normal rate and regular rhythm.     Heart sounds: No murmur heard.    No friction rub. No gallop.  Pulmonary:     Effort: Pulmonary effort is normal. No respiratory distress.     Breath sounds: Normal breath sounds. No wheezing, rhonchi or rales.     Comments: Saturating well on room air Abdominal:      Palpations: Abdomen is soft.     Tenderness: There is no abdominal tenderness.  Musculoskeletal:        General: No swelling or tenderness.     Cervical back: Neck supple.  Skin:    General: Skin is warm and dry.     Capillary Refill: Capillary refill takes less than 2 seconds.     Findings: No rash.  Neurological:     Mental Status: She is alert.     Comments: Alert and oriented.  Cranial nerves II through XII intact, strength 5/5 in all extremities, normal finger-to-nose  Psychiatric:        Mood and Affect: Mood normal.     ED Results / Procedures / Treatments   Labs (all labs ordered are listed, but only abnormal results are displayed) Labs Reviewed  BASIC METABOLIC PANEL - Abnormal; Notable for the following components:      Result Value   Glucose, Bld 112 (*)    All other components within normal limits  CBC    EKG None  Radiology No results found.    Medications Ordered in ED Medications  enalapril (VASOTEC) tablet 10 mg (10 mg Oral Given 12/27/22 0905)    ED Course/ Medical Decision Making/ A&P                                Medical Decision Making Problems Addressed: Acute nonintractable headache, unspecified headache type: complicated acute illness or injury  Amount and/or Complexity of Data Reviewed Labs: ordered.  Risk Prescription drug management.     Patient is a 62 year old female with past medical history of hypertension, memory impairment, HLD, and chronic pain, presenting today for headache.  On exam, patient is awake and alert.  No focal abnormalities noted on neurologic exam.  She is in regular rate and rhythm.  She is in no acute distress.  Given patient is adamant that symptoms are related to lack of her blood pressure medication, she is given her enalapril-HCTZ.  Will also evaluate for anemia, kidney injury, and electrolyte derangement. If this fails to improve her symptoms, will also evaluate for possible migraine headache. Low  suspicion for intracranial hemorrhage at this time given no trauma and no focal neurologic findings. She has no fever, altered mental status, nuchal rigidity, or deficitis to suggest meningitis or encephalitis.  She has no eye redness or vision changes to suggest acute angle glaucoma.  She has no tenderness over her temples to raise concern for GCA.  Patient is given her home dose of blood pressure medication with enalapril and HCTZ.  Lab work reveals no acute abnormalities.  On reevaluation, her  blood pressure is 136/88.  She also states she is feeling much better.  She states she feels ready to return home.  She is provided with a prescription for her blood pressure medication.  She is advised to follow-up closely with her primary care physician.  She is provided with strict return precautions.  She states understanding and agreement with plan   Final Clinical Impression(s) / ED Diagnoses Final diagnoses:  Acute nonintractable headache, unspecified headache type    Rx / DC Orders ED Discharge Orders          Ordered    enalapril-hydrochlorothiazide (VASERETIC) 10-25 MG tablet  Daily        12/27/22 1108              Rhys Martini, DO 12/27/22 1750    Blane Ohara, MD 12/30/22 [phone]

## 2022-12-28 ENCOUNTER — Telehealth: Payer: Self-pay

## 2022-12-28 DIAGNOSIS — I10 Essential (primary) hypertension: Secondary | ICD-10-CM

## 2023-01-08 ENCOUNTER — Other Ambulatory Visit: Payer: Self-pay | Admitting: Family Medicine

## 2023-01-08 DIAGNOSIS — I10 Essential (primary) hypertension: Secondary | ICD-10-CM

## 2023-01-08 MED ORDER — ENALAPRIL-HYDROCHLOROTHIAZIDE 10-25 MG PO TABS
1.0000 | ORAL_TABLET | Freq: Every day | ORAL | 0 refills | Status: DC
Start: 2023-01-08 — End: 2023-01-29

## 2023-01-08 NOTE — Telephone Encounter (Signed)
Patient calls nurse line requesting a refill on Enalapril.   The ED set the prescription to print. However, she reports not getting a physical copy.   Please send in if appropriate.

## 2023-01-08 NOTE — Addendum Note (Signed)
Addended by: Steva Colder on: 01/08/2023 03:02 PM   Modules accepted: Orders

## 2023-01-29 ENCOUNTER — Other Ambulatory Visit: Payer: Self-pay | Admitting: Family Medicine

## 2023-01-29 DIAGNOSIS — R002 Palpitations: Secondary | ICD-10-CM

## 2023-01-29 DIAGNOSIS — I10 Essential (primary) hypertension: Secondary | ICD-10-CM

## 2023-01-29 DIAGNOSIS — F0280 Dementia in other diseases classified elsewhere without behavioral disturbance: Secondary | ICD-10-CM

## 2023-01-29 MED ORDER — DONEPEZIL HCL 5 MG PO TABS
5.0000 mg | ORAL_TABLET | Freq: Every day | ORAL | 0 refills | Status: DC
Start: 2023-01-29 — End: 2023-08-14

## 2023-02-08 ENCOUNTER — Other Ambulatory Visit: Payer: Self-pay

## 2023-02-08 DIAGNOSIS — E785 Hyperlipidemia, unspecified: Secondary | ICD-10-CM

## 2023-02-08 MED ORDER — ATORVASTATIN CALCIUM 40 MG PO TABS
40.0000 mg | ORAL_TABLET | Freq: Every day | ORAL | 0 refills | Status: DC
Start: 2023-02-08 — End: 2023-11-21

## 2023-02-11 ENCOUNTER — Other Ambulatory Visit: Payer: Self-pay | Admitting: Family Medicine

## 2023-02-11 DIAGNOSIS — R002 Palpitations: Secondary | ICD-10-CM

## 2023-02-11 DIAGNOSIS — I10 Essential (primary) hypertension: Secondary | ICD-10-CM

## 2023-02-11 MED ORDER — METOPROLOL SUCCINATE ER 50 MG PO TB24
50.0000 mg | ORAL_TABLET | Freq: Every day | ORAL | 0 refills | Status: DC
Start: 2023-02-11 — End: 2023-03-29

## 2023-02-26 ENCOUNTER — Ambulatory Visit: Payer: Medicaid Other | Attending: Cardiology | Admitting: Cardiology

## 2023-02-27 ENCOUNTER — Encounter: Payer: Self-pay | Admitting: Cardiology

## 2023-03-28 ENCOUNTER — Other Ambulatory Visit: Payer: Self-pay | Admitting: Family Medicine

## 2023-03-28 DIAGNOSIS — R002 Palpitations: Secondary | ICD-10-CM

## 2023-03-28 DIAGNOSIS — I10 Essential (primary) hypertension: Secondary | ICD-10-CM

## 2023-03-29 ENCOUNTER — Telehealth: Payer: Self-pay

## 2023-03-29 ENCOUNTER — Other Ambulatory Visit: Payer: Self-pay | Admitting: Family Medicine

## 2023-03-29 DIAGNOSIS — I10 Essential (primary) hypertension: Secondary | ICD-10-CM

## 2023-03-29 DIAGNOSIS — R002 Palpitations: Secondary | ICD-10-CM

## 2023-03-29 MED ORDER — METOPROLOL SUCCINATE ER 50 MG PO TB24
50.0000 mg | ORAL_TABLET | Freq: Every day | ORAL | 0 refills | Status: DC
Start: 2023-03-29 — End: 2023-04-22

## 2023-03-29 NOTE — Telephone Encounter (Signed)
[facility] calls nurse line in regards to Metoprolol prescription.   Pharmacist reports they need more "specified" directions and they can not use "take with or following a meal."   Is patient taking 3 times per day with a meal etc...   Will forward to PCP.

## 2023-04-05 ENCOUNTER — Other Ambulatory Visit: Payer: Self-pay

## 2023-04-05 DIAGNOSIS — F339 Major depressive disorder, recurrent, unspecified: Secondary | ICD-10-CM

## 2023-04-07 MED ORDER — VENLAFAXINE HCL ER 75 MG PO CP24
ORAL_CAPSULE | ORAL | 0 refills | Status: DC
Start: 2023-04-07 — End: 2023-04-22

## 2023-04-13 ENCOUNTER — Other Ambulatory Visit: Payer: Self-pay | Admitting: Family Medicine

## 2023-04-13 DIAGNOSIS — I10 Essential (primary) hypertension: Secondary | ICD-10-CM

## 2023-04-20 ENCOUNTER — Other Ambulatory Visit: Payer: Self-pay | Admitting: Family Medicine

## 2023-04-20 DIAGNOSIS — I10 Essential (primary) hypertension: Secondary | ICD-10-CM

## 2023-04-20 DIAGNOSIS — R002 Palpitations: Secondary | ICD-10-CM

## 2023-04-20 DIAGNOSIS — F339 Major depressive disorder, recurrent, unspecified: Secondary | ICD-10-CM

## 2023-06-12 ENCOUNTER — Other Ambulatory Visit: Payer: Self-pay | Admitting: Family Medicine

## 2023-06-12 DIAGNOSIS — I10 Essential (primary) hypertension: Secondary | ICD-10-CM

## 2023-06-12 DIAGNOSIS — R002 Palpitations: Secondary | ICD-10-CM

## 2023-06-30 NOTE — Progress Notes (Signed)
    SUBJECTIVE:   CHIEF COMPLAINT / HPI:   Bilateral arm pain from shoulder to wrist -ongoing several months but worse over the past 2 days -denies numbness/tingling -previously referred to physical therapy but was never able to f/u with them -denies hx of injury   Bilateral eye pain -ongoing several months, worse over the past 2 days. Feels as if she has a "ball inside her left eye"  -denies flashes of lights or floaters -denies loss of vision or blurriness. Wears glasses -Mostly painful but occasionally becomes itchy. Denies redness or crusting -pain is primarily in her eyes but has also been having some frontal headache today -Denies pain with specific movement of eyes -had seen neuro and ophtho in [place] but not here.  Someone in the past does mention glaucoma to her but she is not on any medications for this.  Not currently using any eyedrops   Spot on finger L index finger, proximal to fingernail Reports spot has been present for several months but occasionally gets painful No recent fevers.  No swelling or drainage   PERTINENT  PMH / PSH: History of right rotator cuff tear diagnosed in demented in [place]  OBJECTIVE:   BP 124/80   Pulse 68   Wt 163 lb (73.9 kg)   SpO2 98%   BMI 29.81 kg/m   General: NAD, pleasant, able to participate in exam Respiratory: No respiratory distress Skin: warm and dry, no rashes noted Psych: Normal affect and mood Neuro: EOMI, PERRLA, CN II-XII intact without focal deficits. Sensation and motor function intact in b/l upper and lower extremities MSK:  B/l shoulders/arms No gross deformity, ecchymosis, swelling Bilateral shoulders and arms nontender to palpation FROM with some reported b/l arm pain with internal rotation ?positive hawkins on left, negative on R Negative neer's, empty can bilaterally NVI distally  L index finger with nontender and nonswollen lesion as below     ASSESSMENT/PLAN:   Assessment & Plan Eye pain,  bilateral Chronic, possible history of glaucoma.  Per chart review she has been referred to ophthalmology in the past.  Reassuringly no blindness or vision loss, no flashing lights or red flags to suggest acute detachment.  Referral to ophthalmology for further evaluation. Discussed return/ED precautions Bilateral arm pain Exam overall unrevealing though may have a distant history of rotator cuff injury per chart review.  Patient is interested more in diagnostic evaluation rather than physical therapy at this time.  Referral to sports medicine Periungual wart Given chronicity and lack of abscess or infectious symptoms favor this over paronychia.  Advised use of OTC Compound W, follow-up at next visit. Consider cryotherapy if no improvement  Given patient's language barrier and documented hx of alzheimer's, called patient's son-in-law (documented first point of contact) and discussed plan of care with interpreter  Vonna Drafts, MD [facility]

## 2023-07-01 ENCOUNTER — Encounter: Payer: Self-pay | Admitting: Family Medicine

## 2023-07-01 ENCOUNTER — Ambulatory Visit: Payer: Medicaid Other | Admitting: Family Medicine

## 2023-07-01 VITALS — BP 124/80 | HR 68 | Wt 163.0 lb

## 2023-07-01 DIAGNOSIS — M79601 Pain in right arm: Secondary | ICD-10-CM | POA: Diagnosis not present

## 2023-07-01 DIAGNOSIS — H5713 Ocular pain, bilateral: Secondary | ICD-10-CM

## 2023-07-01 DIAGNOSIS — B078 Other viral warts: Secondary | ICD-10-CM | POA: Diagnosis not present

## 2023-07-01 DIAGNOSIS — Z23 Encounter for immunization: Secondary | ICD-10-CM | POA: Diagnosis present

## 2023-07-01 DIAGNOSIS — M79602 Pain in left arm: Secondary | ICD-10-CM

## 2023-07-01 NOTE — Assessment & Plan Note (Addendum)
Chronic, possible history of glaucoma.  Per chart review she has been referred to ophthalmology in the past.  Reassuringly no blindness or vision loss, no flashing lights or red flags to suggest acute detachment.  Referral to ophthalmology for further evaluation. Discussed return/ED precautions

## 2023-07-01 NOTE — Patient Instructions (Addendum)
You should receive a call within the next few weeks to set up an appointment with the ophthalmologist and sports medicine doctors  Please try using Compound W over-the-counter for the spot on your finger  Continue current medications

## 2023-07-09 ENCOUNTER — Ambulatory Visit: Payer: Medicaid Other | Admitting: Family Medicine

## 2023-07-10 ENCOUNTER — Other Ambulatory Visit: Payer: Self-pay | Admitting: Family Medicine

## 2023-07-10 DIAGNOSIS — I10 Essential (primary) hypertension: Secondary | ICD-10-CM

## 2023-07-10 DIAGNOSIS — R002 Palpitations: Secondary | ICD-10-CM

## 2023-07-16 ENCOUNTER — Ambulatory Visit
Admission: RE | Admit: 2023-07-16 | Discharge: 2023-07-16 | Disposition: A | Discharge status: Home / Self Care | Source: Ambulatory Visit | Attending: Family Medicine | Admitting: Family Medicine

## 2023-07-16 ENCOUNTER — Encounter: Payer: Self-pay | Admitting: Family Medicine

## 2023-07-16 ENCOUNTER — Ambulatory Visit (INDEPENDENT_AMBULATORY_CARE_PROVIDER_SITE_OTHER): Admitting: Family Medicine

## 2023-07-16 VITALS — BP 118/72 | Ht 62.0 in | Wt 163.0 lb

## 2023-07-16 DIAGNOSIS — M79601 Pain in right arm: Secondary | ICD-10-CM

## 2023-07-16 DIAGNOSIS — M79602 Pain in left arm: Secondary | ICD-10-CM | POA: Diagnosis not present

## 2023-07-16 DIAGNOSIS — M5412 Radiculopathy, cervical region: Secondary | ICD-10-CM

## 2023-07-16 MED ORDER — PREDNISONE 20 MG PO TABS: 20.0000 mg | ORAL_TABLET | Freq: Two times a day (BID) | ORAL | 0 refills | Status: DC

## 2023-07-16 NOTE — Progress Notes (Unsigned)
 PCP: Vonna Drafts, MD  Chief Complaint: Bilateral arm pain Subjective:   HPI: Patient is a 63 y.o. female here for bilateral arm pain.  Patient states that she has been dealing with bilateral arm pain for quite a while now, patient states that it has been over a year.  Patient states that the pain starts somewhere in her elbows and goes up to her shoulders.  Patient is pointing at the biceps a lot for the visit and states that that is where a lot of the pain occurs.  Patient denies any inciting event.  Patient states that the pain comes and goes but that when it is really bad she can barely put on her clothes.  Patient states that it flares up approximately 3 times a week.  Patient states that she cleans a lot at home and that she has a hard time doing any work when it flares up.  Patient states that the pain keeps her up at night as well    Past Medical History:  Diagnosis Date   Blurred vision, bilateral 09/25/2016   Cognitive impairment 12/16/2017   Dyspnea 03/13/2016   Early onset dementia [facility]), uncertain nature 12/16/2017   Essential hypertension 2011   Hypertension    Migraines 2006   Overweight (BMI 25.0-29.9) 04/04/2018   Seasonal allergies 2014    Current Outpatient Medications on File Prior to Visit  Medication Sig Dispense Refill   atorvastatin (LIPITOR) 40 MG tablet Take 1 tablet (40 mg total) by mouth daily. 90 tablet 0   budesonide-formoterol (SYMBICORT) 80-4.5 MCG/ACT inhaler Inhale 2 puffs into the lungs 2 (two) times daily. 11 g 0   buPROPion (WELLBUTRIN XL) 150 MG 24 hr tablet Take 1 tablet by mouth once daily 90 tablet 0   donepezil (ARICEPT) 5 MG tablet Take 1 tablet (5 mg total) by mouth at bedtime. 90 tablet 0   enalapril-hydrochlorothiazide (VASERETIC) 10-25 MG tablet Take 1 tablet by mouth once daily 30 tablet 0   gabapentin (NEURONTIN) 300 MG capsule Take 1 capsule (300 mg total) by mouth at bedtime. 90 capsule 1   lidocaine (HM LIDOCAINE PATCH) 4 % Apply 1 patch  daily as needed for pain relief 30 patch 0   Melatonin 1 MG CHEW Chew 1 mg by mouth at bedtime. 90 tablet 0   meloxicam (MOBIC) 15 MG tablet Take 1 tablet by mouth once daily 10 tablet 0   metoprolol succinate (TOPROL-XL) 50 MG 24 hr tablet TAKE 1 TABLET BY MOUTH ONCE DAILY AT BEDTIME WITH OR IMMEDIATELY FOLLOWING A MEAL 30 tablet 0   venlafaxine XR (EFFEXOR-XR) 75 MG 24 hr capsule TAKE 2 CAPSULES BY MOUTH ONCE DAILY WITH BREAKFAST 180 capsule 0   VENTOLIN HFA 108 (90 Base) MCG/ACT inhaler Inhale 1-2 puffs into the lungs every 4 (four) hours as needed for wheezing or shortness of breath. 18 g 4   No current facility-administered medications on file prior to visit.    Past Surgical History:  Procedure Laterality Date   TUBAL LIGATION Bilateral 1990    No Known Allergies  BP 118/72   Ht 5\' 2"  (1.575 m)   Wt 163 lb (73.9 kg)   BMI 29.81 kg/m       No data to display              No data to display              Objective:  Physical Exam:  Gen: NAD, comfortable in exam room  Bilateral shoulder: Inspection of the bilateral shoulder no gross abnormalities.  There is some tenderness to palpation along the biceps muscle as well as its origin and distal insertion.  This is bilateral.  There is no tenderness over the humeral head, trapezius or cervical area.  No tenderness over the lateral or medial epicondyles.  Range of motion of the shoulders is full though pain noted with abduction greater than 100, patient is able to actively abduct and full range of motion.  Patient does have decreased range of motion with external rotation to about 40 degrees bilaterally.  Strength is 5 out of 5 with abduction, external and internal rotation.  Empty can: Negative Internal/external rotation: Negative Neer's: Negative    Assessment & Plan:  1. 1. Bilateral arm pain (Primary) - - DG Shoulder Right; Future - DG Shoulder Left; Future - DG Cervical Spine 2 or 3 views; Future    Brenton Grills MD, PGY-4  Sports Medicine Fellow [facility]

## 2023-07-16 NOTE — Patient Instructions (Signed)
 Por favor, hgase radiografas cuando salga de aqu. Im[facility] [place] [address] [place], [place] [address] [place].

## 2023-07-17 DIAGNOSIS — H02883 Meibomian gland dysfunction of right eye, unspecified eyelid: Secondary | ICD-10-CM | POA: Diagnosis not present

## 2023-07-17 DIAGNOSIS — H02886 Meibomian gland dysfunction of left eye, unspecified eyelid: Secondary | ICD-10-CM | POA: Diagnosis not present

## 2023-07-17 DIAGNOSIS — H04123 Dry eye syndrome of bilateral lacrimal glands: Secondary | ICD-10-CM | POA: Diagnosis not present

## 2023-07-17 DIAGNOSIS — H04129 Dry eye syndrome of unspecified lacrimal gland: Secondary | ICD-10-CM | POA: Diagnosis not present

## 2023-07-23 ENCOUNTER — Ambulatory Visit (INDEPENDENT_AMBULATORY_CARE_PROVIDER_SITE_OTHER): Admitting: Family Medicine

## 2023-07-23 VITALS — BP 126/84 | Ht 62.0 in | Wt 163.0 lb

## 2023-07-23 DIAGNOSIS — M79601 Pain in right arm: Secondary | ICD-10-CM | POA: Diagnosis not present

## 2023-07-23 DIAGNOSIS — M79602 Pain in left arm: Secondary | ICD-10-CM

## 2023-07-23 DIAGNOSIS — M5412 Radiculopathy, cervical region: Secondary | ICD-10-CM

## 2023-07-23 MED ORDER — GABAPENTIN 300 MG PO CAPS: 300.0000 mg | ORAL_CAPSULE | Freq: Three times a day (TID) | ORAL | 2 refills | Status: DC

## 2023-07-23 NOTE — Progress Notes (Signed)
 PCP: Vonna Drafts, MD  Chief Complaint: f/u bilatearl arm pain Subjective:   HPI: Patient is a 63 y.o. female here for follow-up of her bilateral arm pain.  Patient states that she feels much better after taking the steroid.  Patient notes that her pain has decreased by a factor of 5.  Patient still has some pain that radiates down into her hands but notes that it is much more tolerable and manageable.  Patient denies any concern for pain at this time and no other complaints..    Past Medical History:  Diagnosis Date   Blurred vision, bilateral 09/25/2016   Cognitive impairment 12/16/2017   Dyspnea 03/13/2016   Early onset dementia [facility]), uncertain nature 12/16/2017   Essential hypertension 2011   Hypertension    Migraines 2006   Overweight (BMI 25.0-29.9) 04/04/2018   Seasonal allergies 2014    Current Outpatient Medications on File Prior to Visit  Medication Sig Dispense Refill   atorvastatin (LIPITOR) 40 MG tablet Take 1 tablet (40 mg total) by mouth daily. 90 tablet 0   budesonide-formoterol (SYMBICORT) 80-4.5 MCG/ACT inhaler Inhale 2 puffs into the lungs 2 (two) times daily. 11 g 0   buPROPion (WELLBUTRIN XL) 150 MG 24 hr tablet Take 1 tablet by mouth once daily 90 tablet 0   donepezil (ARICEPT) 5 MG tablet Take 1 tablet (5 mg total) by mouth at bedtime. 90 tablet 0   enalapril-hydrochlorothiazide (VASERETIC) 10-25 MG tablet Take 1 tablet by mouth once daily 30 tablet 0   gabapentin (NEURONTIN) 300 MG capsule Take 1 capsule (300 mg total) by mouth at bedtime. 90 capsule 1   lidocaine (HM LIDOCAINE PATCH) 4 % Apply 1 patch daily as needed for pain relief 30 patch 0   Melatonin 1 MG CHEW Chew 1 mg by mouth at bedtime. 90 tablet 0   meloxicam (MOBIC) 15 MG tablet Take 1 tablet by mouth once daily 10 tablet 0   metoprolol succinate (TOPROL-XL) 50 MG 24 hr tablet TAKE 1 TABLET BY MOUTH ONCE DAILY AT BEDTIME WITH OR IMMEDIATELY FOLLOWING A MEAL 30 tablet 0   predniSONE (DELTASONE) 20 MG  tablet Take 1 tablet (20 mg total) by mouth 2 (two) times daily. 10 tablet 0   venlafaxine XR (EFFEXOR-XR) 75 MG 24 hr capsule TAKE 2 CAPSULES BY MOUTH ONCE DAILY WITH BREAKFAST 180 capsule 0   VENTOLIN HFA 108 (90 Base) MCG/ACT inhaler Inhale 1-2 puffs into the lungs every 4 (four) hours as needed for wheezing or shortness of breath. 18 g 4   No current facility-administered medications on file prior to visit.    Past Surgical History:  Procedure Laterality Date   TUBAL LIGATION Bilateral 1990    No Known Allergies  BP 126/84   Ht 5\' 2"  (1.575 m)   Wt 163 lb (73.9 kg)   BMI 29.81 kg/m       No data to display              No data to display              Objective:  Physical Exam:  Gen: NAD, comfortable in exam room  Inspection of cervical spine shows no gross abnormality.  Mild tenderness to palpation over the paraspinal area, decreased from before.  Range of motion of the upper extremities is full and strength is 5 out of 5.   Assessment & Plan:  1. Discussed with patient that at this time her pain is likely related to cervical  radiculitis given her x-ray findings also improvement after the steroid.  Translator was able to explain what her options would be going forward including medication, at epidural steroid injections and eventually surgery.  Patient would like to proceed with a permanent fix. -Will go ahead and do an MRI of the cervical spine at this time, we will await the results but will likely send patient to Dr. Alvester Morin for epidural steroid injection.  Afterwards, will evaluate improvement of patient's pain and if minimal improvement will consider referring [facility] -In the meantime, will increase patient's gabapentin to 900 mg to provide some neuropathic relief.   Brenton Grills MD, PGY-4  Sports Medicine Fellow [facility]

## 2023-07-23 NOTE — Patient Instructions (Addendum)
 We are increasing your gabapentin. Please take 1 capsule 3 times daily. Call [facility] to schedule the MRI of your neck - [phone]. Their address is [address] [place]  Estamos aumentando su gabapentina. Tome 1 cpsula 3 veces al da. Llame a [facility] para programar la resonancia magntica de su cuello: [phone]. Su direccin es [address] [employer] [place]

## 2023-07-24 NOTE — Addendum Note (Signed)
 Addended by: Andi Devon on: 07/24/2023 10:32 AM   Modules accepted: Level of Service

## 2023-08-06 ENCOUNTER — Encounter: Payer: Self-pay | Admitting: Family Medicine

## 2023-08-08 ENCOUNTER — Telehealth: Payer: Self-pay

## 2023-08-08 NOTE — Telephone Encounter (Signed)
 Pt's insurance company denied MRI cervical spine - stating she needed 6 weeks of conservative tx followed by re-evaluation by physician showing no improvement. We will place referral for PT at [facility] in [place]. She will f/u with [place] 6 weeks after starting PT. Will try to resubmit MRI auth at that time. Pt expressed understanding.

## 2023-08-09 ENCOUNTER — Other Ambulatory Visit

## 2023-08-13 ENCOUNTER — Other Ambulatory Visit: Payer: Self-pay | Admitting: Family Medicine

## 2023-08-13 DIAGNOSIS — I10 Essential (primary) hypertension: Secondary | ICD-10-CM

## 2023-08-13 DIAGNOSIS — R002 Palpitations: Secondary | ICD-10-CM

## 2023-08-13 DIAGNOSIS — F0280 Dementia in other diseases classified elsewhere without behavioral disturbance: Secondary | ICD-10-CM

## 2023-09-29 ENCOUNTER — Other Ambulatory Visit: Payer: Self-pay | Admitting: Family Medicine

## 2023-09-29 DIAGNOSIS — F0280 Dementia in other diseases classified elsewhere without behavioral disturbance: Secondary | ICD-10-CM

## 2023-09-29 DIAGNOSIS — R002 Palpitations: Secondary | ICD-10-CM

## 2023-09-29 DIAGNOSIS — I10 Essential (primary) hypertension: Secondary | ICD-10-CM

## 2023-11-12 ENCOUNTER — Other Ambulatory Visit: Payer: Self-pay | Admitting: Family Medicine

## 2023-11-12 DIAGNOSIS — Z1231 Encounter for screening mammogram for malignant neoplasm of breast: Secondary | ICD-10-CM

## 2023-11-14 ENCOUNTER — Other Ambulatory Visit: Payer: Self-pay | Admitting: Family Medicine

## 2023-11-14 ENCOUNTER — Other Ambulatory Visit: Payer: Self-pay | Admitting: Student

## 2023-11-14 DIAGNOSIS — F339 Major depressive disorder, recurrent, unspecified: Secondary | ICD-10-CM

## 2023-11-14 DIAGNOSIS — R002 Palpitations: Secondary | ICD-10-CM

## 2023-11-14 DIAGNOSIS — I10 Essential (primary) hypertension: Secondary | ICD-10-CM

## 2023-11-21 ENCOUNTER — Other Ambulatory Visit: Payer: Self-pay | Admitting: Family Medicine

## 2023-11-21 ENCOUNTER — Other Ambulatory Visit: Payer: Self-pay

## 2023-11-21 DIAGNOSIS — E785 Hyperlipidemia, unspecified: Secondary | ICD-10-CM

## 2023-11-21 DIAGNOSIS — J45909 Unspecified asthma, uncomplicated: Secondary | ICD-10-CM

## 2023-11-29 ENCOUNTER — Ambulatory Visit

## 2024-01-09 ENCOUNTER — Ambulatory Visit

## 2024-01-15 ENCOUNTER — Other Ambulatory Visit: Payer: Self-pay | Admitting: Family Medicine

## 2024-01-15 ENCOUNTER — Other Ambulatory Visit: Payer: Self-pay | Admitting: Student

## 2024-01-15 DIAGNOSIS — I10 Essential (primary) hypertension: Secondary | ICD-10-CM

## 2024-01-15 DIAGNOSIS — R002 Palpitations: Secondary | ICD-10-CM

## 2024-01-30 ENCOUNTER — Ambulatory Visit: Admitting: Family Medicine

## 2024-01-30 ENCOUNTER — Telehealth: Payer: Self-pay

## 2024-01-30 ENCOUNTER — Encounter: Payer: Self-pay | Admitting: Family Medicine

## 2024-01-30 VITALS — BP 120/80 | HR 93 | Ht 62.0 in | Wt 169.0 lb

## 2024-01-30 DIAGNOSIS — Z23 Encounter for immunization: Secondary | ICD-10-CM

## 2024-01-30 DIAGNOSIS — F339 Major depressive disorder, recurrent, unspecified: Secondary | ICD-10-CM | POA: Diagnosis not present

## 2024-01-30 DIAGNOSIS — I10 Essential (primary) hypertension: Secondary | ICD-10-CM | POA: Diagnosis not present

## 2024-01-30 DIAGNOSIS — F0283 Dementia in other diseases classified elsewhere, unspecified severity, with mood disturbance: Secondary | ICD-10-CM

## 2024-01-30 DIAGNOSIS — G300 Alzheimer's disease with early onset: Secondary | ICD-10-CM

## 2024-01-30 DIAGNOSIS — M79601 Pain in right arm: Secondary | ICD-10-CM

## 2024-01-30 DIAGNOSIS — M79602 Pain in left arm: Secondary | ICD-10-CM | POA: Diagnosis not present

## 2024-01-30 MED ORDER — VENLAFAXINE HCL ER 150 MG PO CP24: 150.0000 mg | ORAL_CAPSULE | Freq: Every day | ORAL | 3 refills | Status: DC

## 2024-01-30 NOTE — Assessment & Plan Note (Signed)
 Change venlafaxine  strength to 150 mg instead of taking 2 of the 75 mg to reduce pill burden.  Daughter reports some improvement with this medication since starting it. Scheduled in geriatrics clinic for October 30 for follow-up

## 2024-01-30 NOTE — Progress Notes (Signed)
    SUBJECTIVE:   CHIEF COMPLAINT / HPI:   Here to discuss medications  Brought in by daughter  Daughter is wondering if she can take metoprolol  and Lipitor in the morning instead of at night.  Patient sometimes refuses to take multiple medications at night.  She is also curious if we can change her venlafaxine  from 75 mg capsules to 150 mg capsules as she is currently taking 2 of the 75 mg  Daughter curious about having formal cognitive evaluation given patient's history of dementia.  It does look like she was formally diagnosed several years ago and is currently on donepezil .  Has not followed with [facility] recently.  Daughter also requesting FMLA for her as she is her primary caregiver of her mom and assists with medication assistance as well as transportation to and from office visits  PERTINENT  PMH / PSH: Dementia, hypertension, hyperlipidemia  OBJECTIVE:   BP 120/80   Pulse 93   Ht 5' 2 (1.575 m)   Wt 169 lb (76.7 kg)   SpO2 97%   BMI 30.91 kg/m   General: NAD, pleasant, able to participate in exam Respiratory: No respiratory distress Skin: warm and dry, no rashes noted Psych: Normal affect and mood  ASSESSMENT/PLAN:   Assessment & Plan Early onset Alzheimer's dementia with mood disturbance, unspecified dementia severity (HCC) Recurrent major depressive disorder, remission status unspecified (HCC) Change venlafaxine  strength to 150 mg instead of taking 2 of the 75 mg to reduce pill burden.  Daughter reports some improvement with this medication since starting it. Scheduled in geriatrics clinic for October 30 for follow-up  Bilateral arm pain Was previously evaluated by sports medicine and recommended to undergo PT however they have not heard from PT to schedule.  I placed a new referral Primary hypertension Advised it is okay for patient to take Lipitor and metoprolol  in the morning if desired.  Discussed return precautions should she have any sedating side  effects from the metoprolol .  Recommended that either way she take the medicine at the same time every day.  Filled out FMLA paperwork for daughter Flu vaccine today  Payton Coward, MD [facility]

## 2024-01-30 NOTE — Telephone Encounter (Signed)
 Spoke with Daughter Hadassah while in clinic today to make Geri Appt for Oct 30th at 2:30. Patients daughter was given Geri Assessment packet while in clinic. Nelson Land, CMA

## 2024-01-30 NOTE — Patient Instructions (Addendum)
 Continue all current medications  If needed, her metoprolol  and atorvastatin  can be taken in the morning instead of at night.  Please just make sure that she takes it at the same time every day.  I have sent in a new prescription for venlafaxine  150 mg daily instead of the 2 tablets of 75

## 2024-01-30 NOTE — Assessment & Plan Note (Signed)
 Advised it is okay for patient to take Lipitor and metoprolol  in the morning if desired.  Discussed return precautions should she have any sedating side effects from the metoprolol .  Recommended that either way she take the medicine at the same time every day.

## 2024-01-31 ENCOUNTER — Ambulatory Visit
Admission: RE | Admit: 2024-01-31 | Discharge: 2024-01-31 | Disposition: A | Discharge status: Home / Self Care | Source: Ambulatory Visit | Attending: Family Medicine | Admitting: Family Medicine

## 2024-01-31 DIAGNOSIS — Z1231 Encounter for screening mammogram for malignant neoplasm of breast: Secondary | ICD-10-CM

## 2024-02-28 ENCOUNTER — Other Ambulatory Visit: Payer: Self-pay | Admitting: Family Medicine

## 2024-02-28 DIAGNOSIS — I10 Essential (primary) hypertension: Secondary | ICD-10-CM

## 2024-02-28 DIAGNOSIS — E785 Hyperlipidemia, unspecified: Secondary | ICD-10-CM

## 2024-03-04 ENCOUNTER — Ambulatory Visit: Attending: Family Medicine

## 2024-03-04 ENCOUNTER — Other Ambulatory Visit: Payer: Self-pay

## 2024-03-04 DIAGNOSIS — R293 Abnormal posture: Secondary | ICD-10-CM | POA: Insufficient documentation

## 2024-03-04 DIAGNOSIS — M5412 Radiculopathy, cervical region: Secondary | ICD-10-CM | POA: Insufficient documentation

## 2024-03-04 DIAGNOSIS — M79602 Pain in left arm: Secondary | ICD-10-CM | POA: Insufficient documentation

## 2024-03-04 DIAGNOSIS — M79601 Pain in right arm: Secondary | ICD-10-CM | POA: Insufficient documentation

## 2024-03-04 DIAGNOSIS — M542 Cervicalgia: Secondary | ICD-10-CM | POA: Insufficient documentation

## 2024-03-04 NOTE — Therapy (Signed)
 OUTPATIENT PHYSICAL THERAPY CERVICAL EVALUATION   Patient Name: Nancy Gilbert MRN: [id number] DOB:05/11/1961, 63 y.o., female Today's Date: 03/04/2024  END OF SESSION:  PT End of Session - 03/04/24 1250     Visit Number 1    Number of Visits 16    Date for Recertification  05/04/24    Authorization Type [place] MEDICAID PREPAID    PT Start Time 1100    PT Stop Time 1145    PT Time Calculation (min) 45 min    Activity Tolerance Patient tolerated treatment well          Past Medical History:  Diagnosis Date   Blurred vision, bilateral 09/25/2016   Cognitive impairment 12/16/2017   Dyspnea 03/13/2016   Early onset dementia (HCC), uncertain nature 12/16/2017   Essential hypertension 2011   Hypertension    Migraines 2006   Overweight (BMI 25.0-29.9) 04/04/2018   Seasonal allergies 2014   Past Surgical History:  Procedure Laterality Date   TUBAL LIGATION Bilateral 1990   Patient Active Problem List   Diagnosis Date Noted   Palpitations 11/21/2022   Chronic right shoulder pain 09/19/2022   Asthma 01/03/2020   History of HPV infection 08/11/2019   Chronic insomnia 04/04/2018   Overweight (BMI 25.0-29.9) 04/04/2018   Hypercalcemia 02/28/2018   Eye pain, bilateral 02/26/2018   MDD (major depressive disorder) 01/20/2018   Dementia 12/16/2017   Chronic chest pain 10/25/2017   GERD (gastroesophageal reflux disease) 10/25/2017   Tension headache, chronic 02/12/2017   Tear of right rotator cuff 09/25/2016   Mixed hyperlipidemia 03/13/2016   Primary hypertension 02/28/2016    PCP: Romelle Booty, MD PCP - General   REFERRING PROVIDER: Madelon Donald HERO, DO Ref Provider   REFERRING DIAG: M79.601,M79.602 (ICD-10-CM) - Bilateral arm pain   THERAPY DIAG:  Radiculopathy, cervical region  Abnormal posture  Neck pain  Rationale for Evaluation and Treatment: Rehabilitation  ONSET DATE: CHRONIC (YEARS)  SUBJECTIVE:                                                                                                                                                                                                          SUBJECTIVE STATEMENT: Pins and needles down both arms, chronic pain. Pt has dementia. Pain goes down to BL wrists. Pain comes and goes.    PERTINENT HISTORY:  Patient speaks spanish and is a poor historian due to dementia. Patient also has difficulty conveying feelings/sensations to therapist alongside following instruction.  PAIN:  8C, 10W Are you having pain? Yes: NPRS scale: 10 Pain  location: BL arms to forearms Pain description: pins and needles Aggravating factors: n/a Relieving factors: n/a  PRECAUTIONS: None  RED FLAGS: None     WEIGHT BEARING RESTRICTIONS: No  FALLS:  Has patient fallen in last 6 months? No    PLOF: Independent  PATIENT GOALS: decreasing pain, improve ADL completion   OBJECTIVE:  Note: Objective measures were completed at Evaluation unless otherwise noted.   PATIENT SURVEYS:  Quick Dash:  QUICK DASH  Please rate your ability do the following activities in the last week by selecting the number below the appropriate response.   Activities Rating  Open a tight or new jar.    Do heavy household chores (e.g., wash walls, floors).   Carry a shopping bag or briefcase   Wash your back.   Use a knife to cut food.   Recreational activities in which you take some force or impact through your arm, shoulder or hand (e.g., golf, hammering, tennis, etc.).   During the past week, to what extent has your arm, shoulder or hand problem interfered with your normal social activities with family, friends, neighbors or groups?    During the past week, were you limited in your work or other regular daily activities as a result of your arm, shoulder or hand problem?   Rate the severity of the following symptoms in the last week: Arm, Shoulder, or hand pain.   Rate the severity of the following symptoms in the  last week: Tingling (pins and needles) in your arm, shoulder or hand.   During the past week, how much difficulty have you had sleeping because of the pain in your arm, shoulder or hand?     (A QuickDASH score may not be calculated if there is greater than 1 missing item.)  Quick Dash Disability/Symptom Score: [(sum of  (n) responses/ (n)] x 25 = 45/55  Minimally Clinically Important Difference (MCID): 15-20 points  (Franchignoni, F. et al. (2013). Minimally clinically important difference of the disabilities of the arm, shoulder, and hand outcome measures (DASH) and its shortened version (Quick DASH). Journal of Orthopaedic & Sports Physical Therapy, 44(1), 30-39)   COGNITION: Overall cognitive status: Impaired, History of cognitive impairments - at baseline, and Difficulty to assess due to: Communication impairment Pt has dementia and is spanish speaking  SENSATION: N/t down both BL arms  POSTURE: rounded shoulders and forward head extreme FHP and thoracic kyphosis  PALPATION: TTP cspine UT   CERVICAL ROM:   Wfl F/E Rotation and SB cause pain down arms when flared up  Flexion 100 degrees Abd <90 degrees p! Down elbow  Active ROM A/PROM (deg) eval  Flexion WFL for all movements (today was a good day in terms of pain  Extension   Right lateral flexion   Left lateral flexion   Right rotation   Left rotation    (Blank rows = not tested)  UPPER EXTREMITY ROM:  Active ROM Right eval Left eval  Shoulder flexion 100 p! 100 p!  Shoulder extension    Shoulder abduction <90 p! <90 p!  Shoulder adduction    Shoulder extension    Shoulder internal rotation    Shoulder external rotation    Elbow flexion    Elbow extension    Wrist flexion    Wrist extension    Wrist ulnar deviation    Wrist radial deviation    Wrist pronation    Wrist supination     (Blank rows = not tested)  UPPER EXTREMITY  MMT:  MMT Right eval Left eval  Shoulder flexion 3+ 3+  Shoulder  extension    Shoulder abduction 3+ 3+  Shoulder adduction    Shoulder extension    Shoulder internal rotation    Shoulder external rotation    Middle trapezius    Lower trapezius    Elbow flexion    Elbow extension    Wrist flexion    Wrist extension    Wrist ulnar deviation    Wrist radial deviation    Wrist pronation    Wrist supination    Grip strength     (Blank rows = not tested)  CERVICAL SPECIAL TESTS:  Spurling's test: Negative    TREATMENT DATE:                                                                                                                               TREATMENT 03/04/2024:    Neuromuscular re-ed: *extreme difficulty with describing pain, following therapist directions/cuing due to dementia and being spanish speaking*  Supine cervical rotations 2x8 (DC, language barrier and patient not able to follow instruction) Supine chin tucks x8 Seated thoracic extension and chin tuck x8x3s    Self-care/Home Management: Patient educated on HEP, POC, prognosis, and relevant tissues/anatomy.      PATIENT EDUCATION:  Education details: HEP Person educated: Patient and Child(ren) Education method: Explanation, Demonstration, Actor cues, Verbal cues, and Handouts Education comprehension: returned demonstration, verbal cues required, tactile cues required, and needs further education  HOME EXERCISE PROGRAM: Seated thoracic extension with chin tuck 2x8x3s, 5x/wk, 2x/day  ASSESSMENT:  CLINICAL IMPRESSION: EVAL: Patient is a 63 year old female who presents with BL cervical radiculopathy. Patient presents with deficits in: excessive pain, activity tolerance, BL UE use overhead, muscle weakness. As a result, the patient would benefit from skilled PT to address aforementioned deficits via plan below.   OBJECTIVE IMPAIRMENTS: decreased activity tolerance, decreased cognition, decreased strength, impaired sensation, impaired UE functional use, postural  dysfunction, and pain.   ACTIVITY LIMITATIONS: carrying, lifting, and reach over head  PERSONAL FACTORS: Age, Social background, and Time since onset of injury/illness/exacerbation, dementia, spanish speaking are also affecting patient's functional outcome.   REHAB POTENTIAL: Fair    CLINICAL DECISION MAKING: Evolving/moderate complexity  EVALUATION COMPLEXITY: Moderate   GOALS: Goals reviewed with patient? No  SHORT TERM GOALS: Target date: 03/25/2024    Patient will demonstrate 75% HEP compliance to show independence with self-management of condition  Baseline:  Goal status: INITIAL  2.  Patient will decrease worst pain to 8/10 at most to improve ADL completion and overall QOL  Baseline: 10/10 Goal status: INITIAL    LONG TERM GOALS: Target date: 05/04/22  Patient will demonstrate a 9 point improvement in quick dash to show improvements in ADL completion and overall QOL   Baseline: 45/55 Goal status: INITIAL  2.  Patient will demonstrate 100% HEP compliance to show independence with self-management of condition  Baseline:  0% Goal status: INITIAL  3.  Patient will decrease worst pain to 4/10 at most to improve ADL completion and overall QOL  Baseline: 10/10 Goal status: INITIAL  4.  Patient will have at least [facility] ROM of arms without significant increases in pain to demonstrate improved joint mobility needed for ADL completion  Baseline: 54% Goal status: INITIAL    PLAN:  PT FREQUENCY: 1-2x/week  PT DURATION: 8 weeks  PLANNED INTERVENTIONS: 97110-Therapeutic exercises, 97530- Therapeutic activity, 97112- Neuromuscular re-education, 97535- Self Care, [id number]- Manual therapy, and Patient/Family education  PLAN FOR NEXT SESSION: HEP assessment and progression, symptom modulation, and loading (isolated and/or functional). Manual therapy and NME training as needed. Test for cervical myelopathy.     Washington Greener Jocie Meroney  PT, DPT  03/04/2024, 2:16  PM

## 2024-03-11 NOTE — Progress Notes (Unsigned)
 Provider:  Krystal Bale, MD Location:   [facility]     PCP: Romelle Booty, MD Patient Care Team: Romelle Booty, MD as PCP - General (Family Medicine) Ladell Lenis, DO  Extended Emergency Contact Information Primary Emergency Contact: Leim Splinter  [place] Mobile Phone: [phone] Relation: Relative  Code Status: DNR     03/04/2024   11:12 AM  Advanced Directives  Does Patient Have a Medical Advance Directive? No  Would patient like information on creating a medical advance directive? Yes (MAU/Ambulatory/Procedural Areas - Information given)     [facility]:  In-person Spanish interpreter present, Marieta Patient is accompanied by daughter Merwyn Boots) Primary caregiver:  Silvano Quezada (Husband), Hadassah Boots (daughter), Danise Hoots (grandchild) Patient's Currently living arrangement:  spouse and daughter  Patient information was obtained from historical medical records and prior [facility] geriatric clinic evaluation in 2023  Review of Previsit Geriatric Assessment Clinic questionnaire and Informant Questionnaire on Cognitive Decline in the Elderly (IQCODE); reviewed office visit with neurology ([facility]) 04/20/22 History/Exam limitations:  dementia Primary Care Provider:   Booty Romelle, MD Referring provider:  same Reason for referral:  memory difficulties  Healthcare Insurances: [place] Medicaid ----------------------------------------------------------------------------------------------------------------------------------------------------------------------------------------------------------------------------------------------------------------   HPI by problems:  Chief Complaint  Patient presents with   Memory Loss    Cognitive impairment concern  Are there problems with thinking?  Cognition domains: Memory difficulties and Learning difficulty with new activities or information  When were the changes first noticed?   years  Did this change occur abruptly or gradually?  gradual  How have the changes progressed since then?  worsening  Has there been any tremors or abnormal movements?  no  Have they had in hallucinations or delusions:  no  Have they appeared more anxious or sad lately?  yes  Do they still have interests or activities they enjor doing?  yes  How has their appetite been lately?  show no change  Compared to 5 to 10 years ago, how is the patient at:  Problems with Judgment, e.g., problem making decisions, bad financial decisions, problems with thinking?  yes  Less interested in hobbies or previously enjoyed activities?  yes  Problem remembering things about family and friends e.g. names,  occupations, birthdays, addresses?  yes  Problem remembering conversations or news events a few days later?  yes  Problem remembering what day and month it is? yes  Problem with losing things?  yes  Problem learning to use a new gadget or machine around the house, e.g., cell phones, computer, microwave, remote control?  yes  Problem with handling money for shopping?  yes  Problem handling financial matters, e.g. their pension, checking, credit cards, dealing with the bank?  yes  Problem with getting lost in familiar places?  no    PHQ-9: Flowsheet Row Office Visit from 03/12/2024 in [facility] Dept Of [place]. [facility]  PHQ-9 Total Score 19       03/12/2024    2:21 PM 07/01/2023    3:13 PM 09/19/2022    2:54 PM  PHQ9 SCORE ONLY  PHQ-9 Total Score 19 19  23         Outpatient Encounter Medications as of 03/12/2024  Medication Sig   atorvastatin  (LIPITOR) 40 MG tablet Take 1 tablet by mouth once daily   Cholecalciferol (VITAMIN D ) 50 MCG (2000 UT) CAPS Take 2,000 Units by mouth daily.   diclofenac  Sodium (VOLTAREN ) 1 % GEL Apply 2 g topically  4 (four) times daily.   donepezil  (ARICEPT ) 5 MG tablet TAKE 1 TABLET BY MOUTH AT  BEDTIME   enalapril -hydrochlorothiazide  (VASERETIC ) 10-25 MG tablet Take 1 tablet by mouth once daily   melatonin 5 MG TABS Take 5 mg by mouth at bedtime.   metoprolol  succinate (TOPROL -XL) 50 MG 24 hr tablet TAKE 1 TABLET BY MOUTH ONCE DAILY AT BEDTIME WITH OR IMMEDIATELY FOLLOWING A MEAL   [DISCONTINUED] buPROPion  (WELLBUTRIN  XL) 150 MG 24 hr tablet Take 1 tablet by mouth once daily   [DISCONTINUED] gabapentin  (NEURONTIN ) 300 MG capsule Take 1 capsule (300 mg total) by mouth 3 (three) times daily. (Patient taking differently: Take 300 mg by mouth 2 (two) times daily.)   [DISCONTINUED] Melatonin 1 MG CHEW Chew 1 mg by mouth at bedtime.   [DISCONTINUED] SYMBICORT  80-4.5 MCG/ACT inhaler INHALE 2 PUFFS TWICE DAILY   [DISCONTINUED] venlafaxine  XR (EFFEXOR  XR) 150 MG 24 hr capsule Take 1 capsule (150 mg total) by mouth daily with breakfast.   [DISCONTINUED] VENTOLIN  HFA 108 (90 Base) MCG/ACT inhaler INHALE 1 TO 2 PUFFS BY MOUTH EVERY 4 HOURS AS NEEDED FOR WHEEZING FOR SHORTNESS OF BREATH   buPROPion  (WELLBUTRIN  XL) 150 MG 24 hr tablet Take 1 tablet (150 mg total) by mouth daily.   gabapentin  (NEURONTIN ) 300 MG capsule Take 1 capsule (300 mg total) by mouth 2 (two) times daily.   meloxicam  (MOBIC ) 15 MG tablet Take 1 tablet (15 mg total) by mouth daily.   SYMBICORT  80-4.5 MCG/ACT inhaler Inhale 2 puffs into the lungs 2 (two) times daily.   venlafaxine  XR (EFFEXOR  XR) 150 MG 24 hr capsule Take 1 capsule (150 mg total) by mouth daily with breakfast.   VENTOLIN  HFA 108 (90 Base) MCG/ACT inhaler Inhale 2 puffs into the lungs every 4 (four) hours as needed for wheezing or shortness of breath.   [DISCONTINUED] lidocaine  (HM LIDOCAINE  PATCH) 4 % Apply 1 patch daily as needed for pain relief (Patient not taking: Reported on 03/12/2024)   [DISCONTINUED] meloxicam  (MOBIC ) 15 MG tablet Take 1 tablet by mouth once daily (Patient not taking: Reported on 03/12/2024)   [DISCONTINUED] predniSONE  (DELTASONE ) 20 MG  tablet Take 1 tablet (20 mg total) by mouth 2 (two) times daily. (Patient not taking: Reported on 03/12/2024)   No facility-administered encounter medications on file as of 03/12/2024.    History Patient Active Problem List   Diagnosis Date Noted   MDD (major depressive disorder) 01/20/2018    Priority: High   Dementia 12/16/2017    Priority: High   Asthma 01/03/2020    Priority: Medium    GERD (gastroesophageal reflux disease) 10/25/2017    Priority: Medium    Mixed hyperlipidemia 03/13/2016    Priority: Medium    Primary hypertension 02/28/2016    Priority: Medium    Chronic insomnia 04/04/2018    Priority: Low   Obesity (BMI 30.0-34.9) 04/04/2018    Priority: Low   Tension headache, chronic 02/12/2017    Priority: Low   Past Medical History:  Diagnosis Date   Blurred vision, bilateral 09/25/2016   Chronic chest pain 10/25/2017   Chronic right shoulder pain 09/19/2022   Cognitive impairment 12/16/2017   Dyspnea 03/13/2016   Early onset dementia [facility]), uncertain nature 12/16/2017   Essential hypertension 2011   Eye pain, bilateral 02/26/2018   History of HPV infection 08/11/2019   Hypertension    Migraines 2006   Overweight (BMI 25.0-29.9) 04/04/2018   Palpitations 11/21/2022   Seasonal allergies 2014   Tear  of right rotator cuff 09/25/2016   Past Surgical History:  Procedure Laterality Date   TUBAL LIGATION Bilateral 1990   Family History  Problem Relation Age of Onset   Heart disease Mother    Diabetes Mother    Kidney disease Mother    Hypertension Mother    Hyperlipidemia Mother    Heart disease Father    Hypertension Father    Diabetes Father    Hyperlipidemia Sister    Diabetes Sister    Osteoporosis Sister    Depression Sister    Social History   Socioeconomic History   Marital status: Married    Spouse name: Silvano Quezada   Number of children: Not on file   Years of education: 8th   Highest education level: Not on file  Occupational  History   Occupation: unemployed  Tobacco Use   Smoking status: Never    Passive exposure: Never   Smokeless tobacco: Never  Substance and Sexual Activity   Alcohol use: Never   Drug use: Never   Sexual activity: Not on file  Other Topics Concern   Not on file  Social History Narrative          Cardiovascular Risk Factors: Hypertension, Lipids, and Obesity  Educational History: 8 yrs years formal education Personal History of Seizures: No -  Personal History of Stroke: No -  Personal History of Psychiatric Disorders: Yes - MDD  Basic Activities of Daily Living  Dressing: Self-care Eating: Self-care Ambulation: Self-care Toileting: Self-care Bathing: Self-care  Instrumental Activities of Daily Living Shopping: Total assistance House/Yard Work: [place] of medications: Total assistance Finances: Self-care Telephone: Self-care Transportation: Self-care  Mobility Assist Devices: No assist devices  Caregivers in home: daughter and spouse   Formal Home Health Assistance  Physical Therapy: no  Occupational Therapy: no             Home Aid / Personal Care Service: no             Homemaker services: no  FALLS in last five office visits:     03/12/2024    2:20 PM 01/30/2024    2:43 PM 01/30/2024    2:39 PM 04/20/2022   10:07 AM 03/15/2021    3:14 PM  Fall Risk   Falls in the past year? 0 0 0 1 0  Number falls in past yr: 0 0 0 0 0  Injury with Fall? 0 0 0 0 0  Follow up    Falls evaluation completed       Data saved with a previous flowsheet row definition    Health Maintenance reviewed: Immunization History  Administered Date(s) Administered   INFLUENZA, HIGH DOSE SEASONAL PF 01/30/2024   Influenza, Seasonal, Injecte, Preservative Fre 07/01/2023   Influenza,inj,Quad PF,6+ Mos 02/28/2016, 01/27/2018, 03/15/2021, 01/31/2022   MMR 03/16/2015   Td 03/16/2015   Tdap 03/16/2015   Varicella 03/16/2015, 04/13/2015   Health Maintenance  Topics with due status: Overdue     Topic Date Due   Pneumococcal Vaccine: 50+ Years Never done   Colonoscopy Never done   Zoster Vaccines- Shingrix Never done   DEXA SCAN Never done   COVID-19 Vaccine Never done    Diet: Regular Nutritional supplements: none  Geriatric Syndromes: Impaired Memory or Cognition yes   Joint pain: yes    Vital Signs Weight: 170 lb 2 oz (77.2 kg) Body mass index is 31.12 kg/m. CrCl cannot be calculated (Patient's most recent lab result is older than the maximum  21 days allowed.). Body surface area is 1.84 meters squared. Vitals:   03/12/24 1420 03/12/24 1524  BP: (!) 110/90 119/84  Pulse: 82   SpO2: 100%   Weight: 170 lb 2 oz (77.2 kg)   Height: 5' 2 (1.575 m)    Wt Readings from Last 3 Encounters:  03/12/24 170 lb 2 oz (77.2 kg)  01/30/24 169 lb (76.7 kg)  07/23/23 163 lb (73.9 kg)   Hearing Screening   250Hz  500Hz  1000Hz  2000Hz  4000Hz   Right ear 20 20 20 20 20   Left ear 20 20 20 2 20    Vision Screening   Right eye Left eye Both eyes  Without correction     With correction 20/50 20/30 20/25     Physical Examination:  VS reviewed Physical Exam  Vitals:   03/12/24 1420 03/12/24 1524  BP: (!) 110/90 119/84  Pulse: 82   SpO2: 100%     Gait: varus knees, stiffness with arising from chair, slow gait, no shuffling   No other insights were derived from the general Exam    IQCODE score: 126/26 = 4.7 point average (>3.3 c/w dementia)  [place] Universal Dementia Assessment Scale (RUDAS)  Memory = 4 Body Orientation = 5 Praxis = 0 Drawing = 1 Judgment = 0 Language = 3 -------------------- Total = 13 out of 30 (< 22 is c/w cognitive impairment)        No data to display              No data to display                 04/04/2018    8:43 AM 04/03/2018    8:49 AM  Montreal Cognitive Assessment   Visuospatial/ Executive (0/5) 2 2  Naming (0/3) 2 2  Attention: Read list of digits (0/2) 1 1  Attention:  Read list of letters (0/1) 0 0  Attention: Serial 7 subtraction starting at 100 (0/3) 0 0  Language: Repeat phrase (0/2) 0 0  Language : Fluency (0/1) 0 0  Abstraction (0/2) 1 1  Delayed Recall (0/5) 2 2  Orientation (0/6) 6 6  Total 14 14  Adjusted Score (based on education) 14 14     Labs No components found for: [facility]  Lab Results  Component Value Date   VITAMINB12 938 01/02/2022    No results found for: FOLATE  Lab Results  Component Value Date   TSH 1.520 01/02/2022    No results found for: RPR  Lab Results  Component Value Date   HIV Non Reactive 12/16/2017      Chemistry      Component Value Date/Time   NA 137 12/27/2022 0723   NA 139 01/02/2022 1232   K 3.8 12/27/2022 0723   CL 102 12/27/2022 0723   CO2 25 12/27/2022 0723   BUN 17 12/27/2022 0723   BUN 14 01/02/2022 1232   CREATININE 0.88 12/27/2022 0723   CREATININE 0.92 03/13/2016 1208      Component Value Date/Time   CALCIUM  9.3 12/27/2022 0723   ALKPHOS 97 09/16/2019 1743   AST 18 09/16/2019 1743   ALT 15 09/16/2019 1743   BILITOT <0.2 09/16/2019 1743       CrCl cannot be calculated (Patient's most recent lab result is older than the maximum 21 days allowed.).   Lab Results  Component Value Date   HGBA1C 5.6 02/28/2016     @10RELATIVEDAYS @ Hearing Screening   250Hz  500Hz  1000Hz  2000Hz  4000Hz   Right  ear 20 20 20 20 20   Left ear 20 20 20 2 20    Vision Screening   Right eye Left eye Both eyes  Without correction     With correction 20/50 20/30 20/25    Lab Results  Component Value Date   WBC 8.3 12/27/2022   HGB 13.2 12/27/2022   HCT 39.8 12/27/2022   MCV 89.4 12/27/2022   PLT 259 12/27/2022    No results found for this or any previous visit (from the past 24 hours).  Imaging Brain MRI: within normal limits, no atrophy, No small vessel disease, Neuroquant measurement: whole brain in 55 percentile, hippocampal volume 72 percentile)   Personal  Strengths Supportive family/friends   Support System Strengths Supportive Relationships, Family, and Journalist, Newspaper Code Status: No filed Living will nor HCPOA ------------------------------------------------------------------------------------------------------------------------------------------------------------------------------------------------------------------------------------------------------------------------------------------------------------------------------------------------------------------------------------------------------------------------------------------------------------------------------------------------------------  Assessment and Plan: Please see individual consultation notes from pharmacy and for today.    Problem List Items Addressed This Visit       High   MDD (major depressive disorder) (Chronic)   Relevant Medications   venlafaxine  XR (EFFEXOR  XR) 150 MG 24 hr capsule   buPROPion  (WELLBUTRIN  XL) 150 MG 24 hr tablet     Medium    Asthma   Relevant Medications   VENTOLIN  HFA 108 (90 Base) MCG/ACT inhaler   SYMBICORT  80-4.5 MCG/ACT inhaler   Other Visit Diagnoses       Alzheimer's disease, unspecified (CODE) (HCC)    -  Primary   Relevant Medications   venlafaxine  XR (EFFEXOR  XR) 150 MG 24 hr capsule   buPROPion  (WELLBUTRIN  XL) 150 MG 24 hr tablet   gabapentin  (NEURONTIN ) 300 MG capsule   Other Relevant Orders   CT HEAD WO CONTRAST ( )      No problem-specific Assessment & Plan notes found for this encounter.      Primary Contact: Extended Emergency Contact Information Primary Emergency Contact: Acevedo,Melvi  [place] Mobile Phone: [phone] Relation: Relative   50  minutes face to face were spent in total with precharting, history & physical gathering, nterdisciplinary discussion, patient and caretaker counseling and coordination of care and documentation. The Geriatric medical team meet to  discuss the patient's Greater than 50% of this time was spent in counseling, explanation of diagnosis, planning of further management, and coordination of care.  The interdisciplinary team consisted of representatives from medicine, pharmacy. Discussion occurred about patient's medications during the visit.  Referral to [facility] for SDOH review and education about resources was not made.

## 2024-03-12 ENCOUNTER — Ambulatory Visit: Admitting: Family Medicine

## 2024-03-12 ENCOUNTER — Telehealth: Payer: Self-pay

## 2024-03-12 VITALS — BP 119/84 | HR 82 | Ht 62.0 in | Wt 170.1 lb

## 2024-03-12 DIAGNOSIS — F339 Major depressive disorder, recurrent, unspecified: Secondary | ICD-10-CM

## 2024-03-12 DIAGNOSIS — J45909 Unspecified asthma, uncomplicated: Secondary | ICD-10-CM | POA: Diagnosis not present

## 2024-03-12 DIAGNOSIS — G300 Alzheimer's disease with early onset: Secondary | ICD-10-CM | POA: Diagnosis not present

## 2024-03-12 DIAGNOSIS — G309 Alzheimer's disease, unspecified: Secondary | ICD-10-CM | POA: Diagnosis present

## 2024-03-12 DIAGNOSIS — F0283 Dementia in other diseases classified elsewhere, unspecified severity, with mood disturbance: Secondary | ICD-10-CM

## 2024-03-12 MED ORDER — GABAPENTIN 300 MG PO CAPS: 300.0000 mg | ORAL_CAPSULE | Freq: Two times a day (BID) | ORAL | 3 refills | Status: AC

## 2024-03-12 MED ORDER — SYMBICORT 80-4.5 MCG/ACT IN AERO: 2.0000 | INHALATION_SPRAY | Freq: Two times a day (BID) | RESPIRATORY_TRACT | 3 refills | Status: AC

## 2024-03-12 MED ORDER — VENLAFAXINE HCL ER 150 MG PO CP24: 150.0000 mg | ORAL_CAPSULE | Freq: Every day | ORAL | 3 refills | Status: AC

## 2024-03-12 MED ORDER — VENTOLIN HFA 108 (90 BASE) MCG/ACT IN AERS: 2.0000 | INHALATION_SPRAY | RESPIRATORY_TRACT | 3 refills | Status: AC | PRN

## 2024-03-12 MED ORDER — MELOXICAM 15 MG PO TABS: 15.0000 mg | ORAL_TABLET | Freq: Every day | ORAL | 0 refills | Status: AC

## 2024-03-12 MED ORDER — BUPROPION HCL ER (XL) 150 MG PO TB24: 150.0000 mg | ORAL_TABLET | Freq: Every day | ORAL | 0 refills | Status: AC

## 2024-03-12 NOTE — Progress Notes (Signed)
 S: Pharmacy consulted to complete medication reconciliation for geriatric clinic.  Patient arrives in good spirits and accompanied family member and in-person interpreter. Medication reconciliation completed by patient/caregiver/family member.    Medication profile updated in Medication History Section.    Medication Issues Identified: 1. Multiple medications requiring refill.  Pain management refills discussed with Dr. Keneth.     Patient seen with: Lawson Mao, PharmD Candidate - PY3 student

## 2024-03-12 NOTE — Telephone Encounter (Signed)
 Spoke with patients daughter. She confirmed that they will be coming to todays geri clinic. Nelson Land, CMA

## 2024-03-12 NOTE — Patient Instructions (Addendum)
 You will be called to schedule a Head  Cat scan to make sure there is nothing in the brain interfering with your memory

## 2024-03-13 ENCOUNTER — Telehealth: Payer: Self-pay

## 2024-03-13 ENCOUNTER — Encounter: Payer: Self-pay | Admitting: Family Medicine

## 2024-03-13 NOTE — Telephone Encounter (Signed)
 Received call from [facility] in regards to CT scheduled for 11/3.  She reports they need authorization by 2pm today or they will have to cancel and reschedule the apt.   She reports this was placed as STAT yesterday.   Advised will forward to referral coordinator.

## 2024-03-13 NOTE — Assessment & Plan Note (Signed)
 Nancy Gilbert and RUDAS cognitive test scores are consistent with the MoCA scores of around 14 out of 30 in 2019.   She is either totally dependent or requiring cueing in most iADL. The daughter reports slow, gradual decline over the last 5 to 10 years in her function.  Reversible causes testing has been unrevealing, including a Brain MRI with NeuroQuant volume measurement of the hippocampus.   Stage of Nancy Carlyn dementia is in mild-to-moderate stage.    Neurology note from 04/20/22 describes patient as having had LP in [place]  before [place].  The LP part of a work up for possible work up of her cognitive impairment.  Those results were not available.    I have uncertainty as to the cause of Nancy Quezada' dementia. The onset of cognitive impairment when she 85 years-old or younger is unusual. While her depression may have a role in her cognitive impairment, I am not familiar with depression causing the degree of cognitive impairment apparent on the RUDAS testing.    Assessment and Plan  It may prudent to get Nancy Sabino back to [facility] for consideration of specific testing for AD.  here was a suggestion in the [facility] 04/20/22 note that she may benefit from an LP at [facility] to work-up the cause of her cognitive impairment.  [facility] may also be able to get insurance or patient assistance to cover the cost of the new blood markers of AD and LBD.   I discussed with her daughter the role of her mother having a diet c/w the Mediterranean Diet, walking for 10 to 15 minutes most days of the week, and staying socially active.    Recommend considering a trial off of the donepezil  to see if it is still needed. Donepezil  may be stopped without a titration period.  If stopping it, have family monitor for cognitive, functional, or neuropsychiatric symptoms.

## 2024-03-16 ENCOUNTER — Other Ambulatory Visit

## 2024-03-17 NOTE — Telephone Encounter (Signed)
 [facility] conducts their own PA, looks like they have reached out to the patient to get her scheduled for 03/16/24. Cassell Mary CMA

## 2024-03-18 ENCOUNTER — Ambulatory Visit: Payer: Self-pay | Attending: Family Medicine

## 2024-03-18 ENCOUNTER — Other Ambulatory Visit: Payer: Self-pay | Admitting: Family Medicine

## 2024-03-18 DIAGNOSIS — I10 Essential (primary) hypertension: Secondary | ICD-10-CM

## 2024-03-18 DIAGNOSIS — R293 Abnormal posture: Secondary | ICD-10-CM | POA: Insufficient documentation

## 2024-03-18 DIAGNOSIS — M542 Cervicalgia: Secondary | ICD-10-CM | POA: Insufficient documentation

## 2024-03-18 DIAGNOSIS — M5412 Radiculopathy, cervical region: Secondary | ICD-10-CM | POA: Insufficient documentation

## 2024-03-18 NOTE — Therapy (Signed)
 OUTPATIENT PHYSICAL THERAPY TREATMENT   Patient Name: Nancy Gilbert MRN: [id number] DOB:1960/09/03, 63 y.o., female Today's Date: 03/19/2024  END OF SESSION:  PT End of Session - 03/18/24 1436     Visit Number 2    Number of Visits 16    Date for Recertification  05/04/24    Authorization Type [place] MEDICAID PREPAID    PT Start Time 1445    PT Stop Time 1523    PT Time Calculation (min) 38 min    Activity Tolerance Patient tolerated treatment well           Past Medical History:  Diagnosis Date   Blurred vision, bilateral 09/25/2016   Chronic chest pain 10/25/2017   Chronic right shoulder pain 09/19/2022   Cognitive impairment 12/16/2017   Dyspnea 03/13/2016   Early onset dementia [facility]), uncertain nature 12/16/2017   Essential hypertension 2011   Eye pain, bilateral 02/26/2018   History of HPV infection 08/11/2019   Hypertension    Migraines 2006   Overweight (BMI 25.0-29.9) 04/04/2018   Palpitations 11/21/2022   Seasonal allergies 2014   Tear of right rotator cuff 09/25/2016   Past Surgical History:  Procedure Laterality Date   TUBAL LIGATION Bilateral 1990   Patient Active Problem List   Diagnosis Date Noted   Asthma 01/03/2020   Chronic insomnia 04/04/2018   Obesity (BMI 30.0-34.9) 04/04/2018   MDD (major depressive disorder) 01/20/2018   Dementia 12/16/2017   GERD (gastroesophageal reflux disease) 10/25/2017   Tension headache, chronic 02/12/2017   Mixed hyperlipidemia 03/13/2016   Primary hypertension 02/28/2016    PCP: Romelle Booty, MD PCP - General   REFERRING PROVIDER: Madelon Donald HERO, DO Ref Provider   REFERRING DIAG: M79.601,M79.602 (ICD-10-CM) - Bilateral arm pain   THERAPY DIAG:  Abnormal posture  Neck pain  Radiculopathy, cervical region  Rationale for Evaluation and Treatment: Rehabilitation  ONSET DATE: CHRONIC (YEARS)  SUBJECTIVE:                                                                                                                                                                                                          SUBJECTIVE STATEMENT: In-person Interpreter:  Pt presents to PT with reports of no current N/T down UE, also reports decreased. She has worked on exercise from eval and notes that this is helping.   EVAL: Pins and needles down both arms, chronic pain. Pt has dementia. Pain goes down to BL wrists. Pain comes and goes.    PERTINENT HISTORY:  Patient speaks spanish and is a  poor historian due to dementia. Patient also has difficulty conveying feelings/sensations to therapist alongside following instruction.  PAIN:  8C, 10W Are you having pain? Yes: NPRS scale: 10 Pain location: BL arms to forearms Pain description: pins and needles Aggravating factors: n/a Relieving factors: n/a  PRECAUTIONS: None  RED FLAGS: None     WEIGHT BEARING RESTRICTIONS: No  FALLS:  Has patient fallen in last 6 months? No  PLOF: Independent  PATIENT GOALS: decreasing pain, improve ADL completion   OBJECTIVE:  Note: Objective measures were completed at Evaluation unless otherwise noted.   PATIENT SURVEYS:  Quick Dash:  QUICK DASH  Please rate your ability do the following activities in the last week by selecting the number below the appropriate response.   Activities Rating  Open a tight or new jar.    Do heavy household chores (e.g., wash walls, floors).   Carry a shopping bag or briefcase   Wash your back.   Use a knife to cut food.   Recreational activities in which you take some force or impact through your arm, shoulder or hand (e.g., golf, hammering, tennis, etc.).   During the past week, to what extent has your arm, shoulder or hand problem interfered with your normal social activities with family, friends, neighbors or groups?    During the past week, were you limited in your work or other regular daily activities as a result of your arm, shoulder or hand problem?    Rate the severity of the following symptoms in the last week: Arm, Shoulder, or hand pain.   Rate the severity of the following symptoms in the last week: Tingling (pins and needles) in your arm, shoulder or hand.   During the past week, how much difficulty have you had sleeping because of the pain in your arm, shoulder or hand?     (A QuickDASH score may not be calculated if there is greater than 1 missing item.)  Quick Dash Disability/Symptom Score: [(sum of  (n) responses/ (n)] x 25 = 45/55  Minimally Clinically Important Difference (MCID): 15-20 points  (Franchignoni, F. et al. (2013). Minimally clinically important difference of the disabilities of the arm, shoulder, and hand outcome measures (DASH) and its shortened version (Quick DASH). Journal of Orthopaedic & Sports Physical Therapy, 44(1), 30-39)   COGNITION: Overall cognitive status: Impaired, History of cognitive impairments - at baseline, and Difficulty to assess due to: Communication impairment Pt has dementia and is spanish speaking  SENSATION: N/t down both BL arms  POSTURE: rounded shoulders and forward head extreme FHP and thoracic kyphosis  PALPATION: TTP cspine UT   CERVICAL ROM:   Wfl F/E Rotation and SB cause pain down arms when flared up  Flexion 100 degrees Abd <90 degrees p! Down elbow  Active ROM A/PROM (deg) eval  Flexion WFL for all movements (today was a good day in terms of pain  Extension   Right lateral flexion   Left lateral flexion   Right rotation   Left rotation    (Blank rows = not tested)  UPPER EXTREMITY ROM:  Active ROM Right eval Left eval  Shoulder flexion 100 p! 100 p!  Shoulder extension    Shoulder abduction <90 p! <90 p!  Shoulder adduction    Shoulder extension    Shoulder internal rotation    Shoulder external rotation    Elbow flexion    Elbow extension    Wrist flexion    Wrist extension    Wrist ulnar deviation  Wrist radial deviation    Wrist  pronation    Wrist supination     (Blank rows = not tested)  UPPER EXTREMITY MMT:  MMT Right eval Left eval  Shoulder flexion 3+ 3+  Shoulder extension    Shoulder abduction 3+ 3+  Shoulder adduction    Shoulder extension    Shoulder internal rotation    Shoulder external rotation    Middle trapezius    Lower trapezius    Elbow flexion    Elbow extension    Wrist flexion    Wrist extension    Wrist ulnar deviation    Wrist radial deviation    Wrist pronation    Wrist supination    Grip strength     (Blank rows = not tested)  CERVICAL SPECIAL TESTS:  Spurling's test: Negative    TREATMENT: [facility] Adult PT Treatment:                                                DATE: 03/18/2024 Therapeutic Exercise: Supine chin tuck 2x10 5 hold Seated horizontal abd 2x10 RTB Standing row 2x10 RTB Shoulder ext with scap retraction 2x10 RTB Seated cervical ext SNAG x 10 - 5 hold Manual Therapy: Suboccipital release  Gentle manual cervical traction  PATIENT EDUCATION:  Education details: HEP Person educated: Patient and Child(ren) Education method: Explanation, Demonstration, Actor cues, Verbal cues, and Handouts Education comprehension: returned demonstration, verbal cues required, tactile cues required, and needs further education  HOME EXERCISE PROGRAM: Access Code: [facility] URL: [email or web address] Date: 03/18/2024 Prepared by: Alm Kingdom  Exercises - Supine Chin Tuck  - 1 x daily - 7 x weekly - 3 sets - 10 reps - 3 hold - Seated Shoulder Horizontal Abduction with Resistance - Palms Down  - 1 x daily - 7 x weekly - 3 sets - 10 reps - rojo hold - cervical extension snag with towel  - 1 x daily - 7 x weekly - 3 sets - 10 reps - 3 hold  ASSESSMENT:  CLINICAL IMPRESSION: Pt was able to complete prescribed exercises with no adverse effect. Today we continued to work on neck and periscapular strengthening. HEP updated and printed off while PT gave  daughter instructions post session. Pt seemed to be more cognitively aware this session overall. PT will continue to progress per POC.   EVAL: Patient is a 63 year old female who presents with BL cervical radiculopathy. Patient presents with deficits in: excessive pain, activity tolerance, BL UE use overhead, muscle weakness. As a result, the patient would benefit from skilled PT to address aforementioned deficits via plan below.   OBJECTIVE IMPAIRMENTS: decreased activity tolerance, decreased cognition, decreased strength, impaired sensation, impaired UE functional use, postural dysfunction, and pain.   ACTIVITY LIMITATIONS: carrying, lifting, and reach over head  PERSONAL FACTORS: Age, Social background, and Time since onset of injury/illness/exacerbation, dementia, spanish speaking are also affecting patient's functional outcome.   REHAB POTENTIAL: Fair    CLINICAL DECISION MAKING: Evolving/moderate complexity  EVALUATION COMPLEXITY: Moderate   GOALS: Goals reviewed with patient? No  SHORT TERM GOALS: Target date: 03/25/2024    Patient will demonstrate 75% HEP compliance to show independence with self-management of condition  Baseline:  Goal status: INITIAL  2.  Patient will decrease worst pain to 8/10 at most to improve ADL completion and overall QOL  Baseline: 10/10  Goal status: INITIAL    LONG TERM GOALS: Target date: 05/04/22  Patient will demonstrate a 9 point improvement in quick dash to show improvements in ADL completion and overall QOL   Baseline: 45/55 Goal status: INITIAL  2.  Patient will demonstrate 100% HEP compliance to show independence with self-management of condition  Baseline: 0% Goal status: INITIAL  3.  Patient will decrease worst pain to 4/10 at most to improve ADL completion and overall QOL  Baseline: 10/10 Goal status: INITIAL  4.  Patient will have at least [facility] ROM of arms without significant increases in pain to demonstrate improved  joint mobility needed for ADL completion  Baseline: 54% Goal status: INITIAL    PLAN:  PT FREQUENCY: 1-2x/week  PT DURATION: 8 weeks  PLANNED INTERVENTIONS: 97110-Therapeutic exercises, 97530- Therapeutic activity, 97112- Neuromuscular re-education, 97535- Self Care, [id number]- Manual therapy, and Patient/Family education  PLAN FOR NEXT SESSION: HEP assessment and progression, symptom modulation, and loading (isolated and/or functional). Manual therapy and NME training as needed. Test for cervical myelopathy.     Alm JAYSON Kingdom PT  03/19/24 8:19 AM

## 2024-03-25 ENCOUNTER — Ambulatory Visit: Payer: Self-pay

## 2024-04-01 ENCOUNTER — Ambulatory Visit: Payer: Self-pay

## 2024-04-10 ENCOUNTER — Other Ambulatory Visit: Payer: Self-pay | Admitting: Family Medicine

## 2024-04-10 DIAGNOSIS — E785 Hyperlipidemia, unspecified: Secondary | ICD-10-CM

## 2024-04-10 DIAGNOSIS — R002 Palpitations: Secondary | ICD-10-CM

## 2024-04-10 DIAGNOSIS — I10 Essential (primary) hypertension: Secondary | ICD-10-CM

## 2024-04-15 ENCOUNTER — Encounter: Payer: Self-pay | Admitting: Family Medicine
